# Patient Record
Sex: Female | Born: 1975 | Hispanic: No | Marital: Single | Smoking: Never smoker
Health system: Southern US, Community
[De-identification: ages and names within clinical notes are randomized; demographics above are authoritative.]

## PROBLEM LIST (undated history)

## (undated) DIAGNOSIS — I10 Essential (primary) hypertension: Secondary | ICD-10-CM

---

## 2003-03-10 ENCOUNTER — Inpatient Hospital Stay (HOSPITAL_COMMUNITY): Admission: EM | Admit: 2003-03-10 | Discharge: 2003-03-14 | Payer: Self-pay | Admitting: Emergency Medicine

## 2003-03-25 ENCOUNTER — Encounter: Admission: RE | Admit: 2003-03-25 | Discharge: 2003-03-25 | Payer: Self-pay | Admitting: Internal Medicine

## 2003-05-09 ENCOUNTER — Inpatient Hospital Stay (HOSPITAL_COMMUNITY): Admission: EM | Admit: 2003-05-09 | Discharge: 2003-05-11 | Payer: Self-pay | Admitting: Emergency Medicine

## 2005-03-14 ENCOUNTER — Emergency Department (HOSPITAL_COMMUNITY): Admission: EM | Admit: 2005-03-14 | Discharge: 2005-03-14 | Payer: Self-pay | Admitting: Emergency Medicine

## 2013-01-22 ENCOUNTER — Emergency Department (HOSPITAL_COMMUNITY)
Admission: EM | Admit: 2013-01-22 | Discharge: 2013-01-22 | Disposition: A | Discharge status: Home / Self Care | Payer: Medicaid Other | Attending: Emergency Medicine | Admitting: Emergency Medicine

## 2013-01-22 ENCOUNTER — Encounter (HOSPITAL_COMMUNITY): Payer: Self-pay | Admitting: Emergency Medicine

## 2013-01-22 ENCOUNTER — Emergency Department (HOSPITAL_COMMUNITY): Payer: Medicaid Other

## 2013-01-22 DIAGNOSIS — I10 Essential (primary) hypertension: Secondary | ICD-10-CM | POA: Insufficient documentation

## 2013-01-22 DIAGNOSIS — R52 Pain, unspecified: Secondary | ICD-10-CM | POA: Insufficient documentation

## 2013-01-22 DIAGNOSIS — R911 Solitary pulmonary nodule: Secondary | ICD-10-CM | POA: Diagnosis not present

## 2013-01-22 DIAGNOSIS — R05 Cough: Secondary | ICD-10-CM | POA: Diagnosis present

## 2013-01-22 DIAGNOSIS — J111 Influenza due to unidentified influenza virus with other respiratory manifestations: Secondary | ICD-10-CM | POA: Insufficient documentation

## 2013-01-22 DIAGNOSIS — R000 Tachycardia, unspecified: Secondary | ICD-10-CM | POA: Diagnosis not present

## 2013-01-22 DIAGNOSIS — Z79899 Other long term (current) drug therapy: Secondary | ICD-10-CM | POA: Insufficient documentation

## 2013-01-22 DIAGNOSIS — J9801 Acute bronchospasm: Secondary | ICD-10-CM

## 2013-01-22 HISTORY — DX: Essential (primary) hypertension: I10

## 2013-01-22 MED ORDER — PREDNISONE 20 MG PO TABS: ORAL_TABLET | ORAL | Status: DC

## 2013-01-22 MED ORDER — ALBUTEROL SULFATE HFA 108 (90 BASE) MCG/ACT IN AERS: 2.0000 | INHALATION_SPRAY | RESPIRATORY_TRACT | Status: AC | PRN

## 2013-01-22 MED ORDER — OXYCODONE-ACETAMINOPHEN 5-325 MG PO TABS: 2.0000 | ORAL_TABLET | ORAL | Status: DC | PRN

## 2013-01-22 NOTE — ED Notes (Signed)
Pt. reports persistent productive cough / chest congestion with chills and body aches since Christmas eve .

## 2013-01-22 NOTE — ED Provider Notes (Signed)
CSN: [id number]     Arrival date & time 01/22/13  2015 History   First MD Initiated Contact with Patient 01/22/13 2301     Chief Complaint  Patient presents with  . Cough  . Generalized Body Aches  . Chills   (Consider location/radiation/quality/duration/timing/severity/associated sxs/prior Treatment) HPI About one week of cough chills fever body aches nasal congestion rhinorrhea generalized weakness with a feeling of wheezing and slight shortness of breath no history of asthma no confusion no rash no vomiting no diarrhea no abdominal pain; no treatment prior to arrival except for ibuprofen which minimally helps the body aches. Past Medical History  Diagnosis Date  . Hypertension    Past Surgical History  Procedure Laterality Date  . Cesarean section     No family history on file. History  Substance Use Topics  . Smoking status: Never Smoker   . Smokeless tobacco: Not on file  . Alcohol Use: No   OB History   Grav Para Term Preterm Abortions TAB SAB Ect Mult Living                 Review of Systems 10 Systems reviewed and are negative for acute change except as noted in the HPI. Allergies  Review of patient's allergies indicates no known allergies.  Home Medications   Current Outpatient Rx  Name  Route  Sig  Dispense  Refill  . benzonatate (TESSALON) 100 MG capsule   Oral   Take 100 mg by mouth 3 (three) times daily as needed for cough.         . pseudoephedrine-guaifenesin (MUCINEX D) 60-600 MG per tablet   Oral   Take 1 tablet by mouth every 12 (twelve) hours.         Marland Kitchen albuterol (PROVENTIL HFA;VENTOLIN HFA) 108 (90 BASE) MCG/ACT inhaler   Inhalation   Inhale 2 puffs into the lungs every 2 (two) hours as needed for wheezing or shortness of breath (cough).   1 Inhaler   0   . oxyCODONE-acetaminophen (PERCOCET) 5-325 MG per tablet   Oral   Take 2 tablets by mouth every 4 (four) hours as needed.   20 tablet   0   . predniSONE (DELTASONE) 20 MG tablet      3 tabs po day one, then 2 po daily x 4 days   11 tablet   0    BP 162/74  Pulse 117  Temp(Src) 99.4 F (37.4 C) (Oral)  Resp 18  SpO2 91%  LMP 12/31/2012 Physical Exam  Nursing note and vitals reviewed. Constitutional:  Awake, alert, nontoxic appearance.  HENT:  Head: Atraumatic.  Eyes: Right eye exhibits no discharge. Left eye exhibits no discharge.  Neck: Neck supple.  Cardiovascular: Regular rhythm.   No murmur heard. Mildly tachycardic  Pulmonary/Chest: Effort normal. No respiratory distress. She has wheezes. She has no rales. She exhibits no tenderness.  Pulse oximetry low normal on room air 91% however no retractions no accessory muscle usage patient is able to speak phrases with faint scattered expiratory wheezes  Abdominal: Soft. There is no tenderness. There is no rebound.  Musculoskeletal: She exhibits tenderness. She exhibits no edema.  Baseline ROM, no obvious new focal weakness. Mild diffuse tenderness all 4 extremities and back.  Neurological: She is alert.  Mental status and motor strength appears baseline for patient and situation.  Skin: No rash noted.  Psychiatric: She has a normal mood and affect.    ED Course  Procedures (including critical care time)  Patient / Family / Caregiver informed of clinical course, understand medical decision-making process, and agree with plan. Labs Review Labs Reviewed - No data to display Imaging Review Dg Chest 2 View  01/22/2013   CLINICAL DATA:  Cough, headache, body aches, chills. Worsening over the past few days.  EXAM: CHEST  2 VIEW  COMPARISON:  03/14/2005  FINDINGS: Shallow inspiration. There is an new 10 mm nodular opacity in the right upper lung. This may be related to shallow inspiration and vascular crowding but and developing nodule is not excluded. Consider follow-up chest x-ray an 3 months after resolution of acute symptoms versus CT further evaluation. There is linear atelectasis in the lung bases. No  focal consolidation or airspace disease. No blunting of costophrenic angles. No pneumothorax. Normal heart size and pulmonary vascularity.  IMPRESSION: Shallow inspiration with atelectasis in the lung bases. New nodular opacity in the right upper lung. Consider follow-up radiograph versus CT as discussed. No findings suggesting pneumonia.   Electronically Signed   By: Burman Nieves M.D.   On: 01/22/2013 21:48    EKG Interpretation   None       MDM   1. Influenza   2. Bronchospasm, acute   3. Lung nodule seen on imaging study    I doubt any other EMC precluding discharge at this time including, but not necessarily limited to the following:SBI.    Hurman Horn, MD 01/23/13 [phone]

## 2013-01-24 ENCOUNTER — Emergency Department (HOSPITAL_COMMUNITY): Payer: Medicaid Other

## 2013-01-24 ENCOUNTER — Inpatient Hospital Stay (HOSPITAL_COMMUNITY)
Admission: EM | Admit: 2013-01-24 | Discharge: 2013-01-29 | DRG: 193 | Disposition: A | Discharge status: Home / Self Care | Payer: Medicaid Other | Attending: Internal Medicine | Admitting: Internal Medicine

## 2013-01-24 ENCOUNTER — Encounter (HOSPITAL_COMMUNITY): Payer: Self-pay | Admitting: Emergency Medicine

## 2013-01-24 DIAGNOSIS — D72829 Elevated white blood cell count, unspecified: Secondary | ICD-10-CM

## 2013-01-24 DIAGNOSIS — J189 Pneumonia, unspecified organism: Principal | ICD-10-CM | POA: Diagnosis present

## 2013-01-24 DIAGNOSIS — R0902 Hypoxemia: Secondary | ICD-10-CM

## 2013-01-24 DIAGNOSIS — J9801 Acute bronchospasm: Secondary | ICD-10-CM

## 2013-01-24 DIAGNOSIS — E876 Hypokalemia: Secondary | ICD-10-CM | POA: Diagnosis present

## 2013-01-24 DIAGNOSIS — I10 Essential (primary) hypertension: Secondary | ICD-10-CM | POA: Diagnosis present

## 2013-01-24 DIAGNOSIS — R911 Solitary pulmonary nodule: Secondary | ICD-10-CM

## 2013-01-24 DIAGNOSIS — R0602 Shortness of breath: Secondary | ICD-10-CM | POA: Diagnosis present

## 2013-01-24 DIAGNOSIS — R7309 Other abnormal glucose: Secondary | ICD-10-CM | POA: Diagnosis present

## 2013-01-24 DIAGNOSIS — J029 Acute pharyngitis, unspecified: Secondary | ICD-10-CM | POA: Diagnosis present

## 2013-01-24 DIAGNOSIS — J9819 Other pulmonary collapse: Secondary | ICD-10-CM | POA: Diagnosis present

## 2013-01-24 DIAGNOSIS — R0682 Tachypnea, not elsewhere classified: Secondary | ICD-10-CM

## 2013-01-24 DIAGNOSIS — J9600 Acute respiratory failure, unspecified whether with hypoxia or hypercapnia: Secondary | ICD-10-CM | POA: Diagnosis present

## 2013-01-24 DIAGNOSIS — Z8679 Personal history of other diseases of the circulatory system: Secondary | ICD-10-CM

## 2013-01-24 DIAGNOSIS — R739 Hyperglycemia, unspecified: Secondary | ICD-10-CM

## 2013-01-24 LAB — PRO B NATRIURETIC PEPTIDE: Pro B Natriuretic peptide (BNP): 170 pg/mL — ABNORMAL HIGH (ref 0–125)

## 2013-01-24 LAB — BASIC METABOLIC PANEL
GFR calc Af Amer: >90 mL/min (ref >90)
GFR calc non Af Amer: >90 mL/min (ref >90)
Potassium: 3.7 mEq/L (ref 3.7–5.3)
Sodium: 139 mEq/L (ref 137–147)

## 2013-01-24 LAB — POCT I-STAT, CHEM 8
HCT: 45 % (ref 36.0–46.0)
Hemoglobin: 15.3 g/dL — ABNORMAL HIGH (ref 12.0–15.0)
Potassium: 3.5 mEq/L — ABNORMAL LOW (ref 3.7–5.3)
Sodium: 140 mEq/L (ref 137–147)

## 2013-01-24 LAB — PHOSPHORUS: Phosphorus: 2.2 mg/dL — ABNORMAL LOW (ref 2.3–4.6)

## 2013-01-24 LAB — CBC
Hemoglobin: 14 g/dL (ref 12.0–15.0)
RBC: 4.51 MIL/uL (ref 3.87–5.11)

## 2013-01-24 LAB — MAGNESIUM: Magnesium: 2.2 mg/dL (ref 1.5–2.5)

## 2013-01-24 LAB — D-DIMER, QUANTITATIVE: D-Dimer, Quant: 0.69 ug/mL-FEU — ABNORMAL HIGH (ref 0.00–0.48)

## 2013-01-24 LAB — RAPID STREP SCREEN (MED CTR MEBANE ONLY): Streptococcus, Group A Screen (Direct): NEGATIVE

## 2013-01-24 MED ORDER — ENOXAPARIN SODIUM 40 MG/0.4ML ~~LOC~~ SOLN
40.0000 mg | SUBCUTANEOUS | Status: DC
Start: 2013-01-24 — End: 2013-01-29
  Administered 2013-01-24 – 2013-01-28 (×5): 40 mg via SUBCUTANEOUS
  Filled 2013-01-24 (×6): qty 0.4

## 2013-01-24 MED ORDER — BIOTENE DRY MOUTH MT LIQD
15.0000 mL | Freq: Two times a day (BID) | OROMUCOSAL | Status: DC
  Administered 2013-01-25 – 2013-01-29 (×9): 15 mL via OROMUCOSAL

## 2013-01-24 MED ORDER — MORPHINE SULFATE 4 MG/ML IJ SOLN
4.0000 mg | Freq: Once | INTRAMUSCULAR | Status: AC
  Administered 2013-01-24: 4 mg via INTRAVENOUS
  Filled 2013-01-24: qty 1

## 2013-01-24 MED ORDER — OSELTAMIVIR PHOSPHATE 75 MG PO CAPS
75.0000 mg | ORAL_CAPSULE | Freq: Two times a day (BID) | ORAL | Status: DC
  Administered 2013-01-24: 75 mg via ORAL
  Filled 2013-01-24 (×3): qty 1

## 2013-01-24 MED ORDER — METHYLPREDNISOLONE SODIUM SUCC 125 MG IJ SOLR
125.0000 mg | Freq: Two times a day (BID) | INTRAMUSCULAR | Status: DC
  Administered 2013-01-24 – 2013-01-28 (×8): 125 mg via INTRAVENOUS
  Filled 2013-01-24 (×9): qty 2

## 2013-01-24 MED ORDER — ACETAMINOPHEN 325 MG PO TABS: 650.0000 mg | ORAL_TABLET | Freq: Four times a day (QID) | ORAL | Status: DC | PRN

## 2013-01-24 MED ORDER — ALBUTEROL SULFATE (5 MG/ML) 0.5% IN NEBU
5.0000 mg | INHALATION_SOLUTION | Freq: Once | RESPIRATORY_TRACT | Status: AC
  Administered 2013-01-24: 5 mg via RESPIRATORY_TRACT
  Filled 2013-01-24: qty 6

## 2013-01-24 MED ORDER — SODIUM CHLORIDE 0.9 % IV BOLUS (SEPSIS)
2000.0000 mL | Freq: Once | INTRAVENOUS | Status: AC
  Administered 2013-01-24: 2000 mL via INTRAVENOUS

## 2013-01-24 MED ORDER — POTASSIUM CHLORIDE 10 MEQ/100ML IV SOLN
10.0000 meq | INTRAVENOUS | Status: AC
  Administered 2013-01-25 (×3): 10 meq via INTRAVENOUS
  Filled 2013-01-24 (×3): qty 100

## 2013-01-24 MED ORDER — AZITHROMYCIN 500 MG PO TABS
500.0000 mg | ORAL_TABLET | Freq: Every day | ORAL | Status: AC
  Administered 2013-01-24: 500 mg via ORAL
  Filled 2013-01-24: qty 1

## 2013-01-24 MED ORDER — DEXTROSE 5 % IV SOLN
1.0000 g | INTRAVENOUS | Status: DC
  Administered 2013-01-24: 1 g via INTRAVENOUS
  Filled 2013-01-24 (×2): qty 10

## 2013-01-24 MED ORDER — ALUM & MAG HYDROXIDE-SIMETH 200-200-20 MG/5ML PO SUSP: 30.0000 mL | Freq: Four times a day (QID) | ORAL | Status: DC | PRN

## 2013-01-24 MED ORDER — POTASSIUM CHLORIDE IN NACL 20-0.9 MEQ/L-% IV SOLN
INTRAVENOUS | Status: DC
  Administered 2013-01-24 – 2013-01-25 (×2): via INTRAVENOUS
  Administered 2013-01-26: 10:00:00 1000 mL via INTRAVENOUS
  Administered 2013-01-26 – 2013-01-28 (×5): via INTRAVENOUS
  Filled 2013-01-24 (×12): qty 1000

## 2013-01-24 MED ORDER — ALBUTEROL SULFATE (2.5 MG/3ML) 0.083% IN NEBU
2.5000 mg | INHALATION_SOLUTION | RESPIRATORY_TRACT | Status: DC | PRN
  Administered 2013-01-27: 2.5 mg via RESPIRATORY_TRACT
  Filled 2013-01-24: qty 3

## 2013-01-24 MED ORDER — MAGIC MOUTHWASH W/LIDOCAINE
5.0000 mL | Freq: Four times a day (QID) | ORAL | Status: DC | PRN
  Administered 2013-01-25: 5 mL via ORAL
  Filled 2013-01-24 (×2): qty 5

## 2013-01-24 MED ORDER — DOCUSATE SODIUM 100 MG PO CAPS
100.0000 mg | ORAL_CAPSULE | Freq: Two times a day (BID) | ORAL | Status: DC
  Administered 2013-01-24 – 2013-01-29 (×10): 100 mg via ORAL
  Filled 2013-01-24 (×13): qty 1

## 2013-01-24 MED ORDER — ACETAMINOPHEN 650 MG RE SUPP: 650.0000 mg | Freq: Four times a day (QID) | RECTAL | Status: DC | PRN

## 2013-01-24 MED ORDER — OSELTAMIVIR PHOSPHATE 75 MG PO CAPS
75.0000 mg | ORAL_CAPSULE | Freq: Once | ORAL | Status: AC
  Administered 2013-01-24: 75 mg via ORAL
  Filled 2013-01-24: qty 1

## 2013-01-24 MED ORDER — AZITHROMYCIN 250 MG PO TABS
250.0000 mg | ORAL_TABLET | Freq: Every day | ORAL | Status: DC
  Filled 2013-01-24: qty 1

## 2013-01-24 NOTE — ED Notes (Signed)
Pt.was sating at 90 % without o292%witho2 on.

## 2013-01-24 NOTE — Progress Notes (Signed)
ANTIBIOTIC CONSULT NOTE - INITIAL  Pharmacy Consult for ceftriaxone Indication: pneumonia  No Known Allergies  Patient Measurements: Height: 5' 2.4" (158.5 cm) Weight: 162 lb 12.8 oz (73.846 kg) IBW/kg (Calculated) : 51.02 Adjusted Body Weight:   Vital Signs: Temp: 98.3 F (36.8 C) (12/31 2023) Temp src: Oral (12/31 2023) BP: 120/68 mmHg (12/31 2023) Pulse Rate: 80 (12/31 2023) Intake/Output from previous day:   Intake/Output from this shift:    Labs:  Recent Labs  01/24/13 1720 01/24/13 1732  WBC 12.3*  --   HGB 14.0 15.3*  PLT 244  --   CREATININE 0.41* 0.50   Estimated Creatinine Clearance: 90.5 ml/min (by C-G formula based on Cr of 0.5). No results found for this basename: VANCOTROUGH, VANCOPEAK, VANCORANDOM, GENTTROUGH, GENTPEAK, GENTRANDOM, TOBRATROUGH, TOBRAPEAK, TOBRARND, AMIKACINPEAK, AMIKACINTROU, AMIKACIN,  in the last 72 hours   Microbiology: No results found for this or any previous visit (from the past 720 hour(s)).  Medical History: Past Medical History  Diagnosis Date  . Hypertension     Medications:  Scheduled:  . azithromycin  500 mg Oral Daily   Followed by  . [START ON 01/25/2013] azithromycin  250 mg Oral Daily  . docusate sodium  100 mg Oral BID  . enoxaparin (LOVENOX) injection  40 mg Subcutaneous Q24H  . methylPREDNISolone (SOLU-MEDROL) injection  125 mg Intravenous Q12H  . potassium chloride  10 mEq Intravenous Q1 Hr x 3   Infusions:  . 0.9 % NaCl with KCl 20 mEq / L     Assessment: 37 yo female with pneumonia will be put on ceftriaxone.     Plan:  1) Ceftriaxone 1g iv q24h  Marchelle Rinella, Tsz-Yin 01/24/2013,9:56 PM

## 2013-01-24 NOTE — H&P (Signed)
[facility] History and Physical  Morgan Hunter XBJ:[id number] DOB: 1975/04/10 DOA: 01/24/2013  Referring physician: ED PCP: No primary provider on file.  Specialists: none  Chief Complaint: dyspnea  HPI: Morgan Hunter is a 37 y.o. female  This patient is admitted for shortness of breath. She was seen in emergency room 2 nights ago for shortness of breath, coughing, and sore throat. She was discharged in stable condition albuterol inhaler, prednisone and some pain medicine. Since that time should 3 episodes of posttussive vomiting. The shortness of breath has become worse. Coughing has increased. Sore throat has not resolved. Emergency room she was found to have an oxygen sat on room air of 89-90 and a respiratory rate of 28 per minute 2 L oxygen her sats are found to be 94-95. X-ray shows worsening of airways disease compared to 2 nights ago although they note atypical infection including viral etiologies may have similar appearance. The patient denies any fevers over the last week. She says a few days ago she had myalgias but they are gone.  62 nights ago found a right upper lobe nodule. Recommendation was to repeat chest x-ray in 3 months versus CT scan. And today's chest x-ray shows decreased conspicuously of this nodule.  Review of Systems: The patient denies anorexia, fever, weight loss,, vision loss, decreased hearing, hoarseness, chest pain, syncope,peripheral edema, balance deficits, hemoptysis, abdominal pain, melena, hematochezia, severe indigestion/heartburn, hematuria, incontinence, genital sores, muscle weakness, suspicious skin lesions, transient blindness, difficulty walking, depression, unusual weight change, abnormal bleeding, enlarged lymph nodes, angioedema, and breast masses.   C/o dyspnea on exertion, shob, cough, post tussive emesis  Past Medical History  Diagnosis Date  . Hypertension    Past Surgical History  Procedure Laterality Date  .  Cesarean section     Social History:  reports that she has never smoked. She does not have any smokeless tobacco history on file. She reports that she does not drink alcohol or use illicit drugs. Pt lives at home and is able to do all ADLs  No Known Allergies  Family History  Problem Relation Age of Onset  . Hypertension Mother     Prior to Admission medications   Medication Sig Start Date End Date Taking? Authorizing Provider  albuterol (PROVENTIL HFA;VENTOLIN HFA) 108 (90 BASE) MCG/ACT inhaler Inhale 2 puffs into the lungs every 2 (two) hours as needed for wheezing or shortness of breath (cough). 01/22/13  Yes Hurman Horn, MD  HYDROcodone-acetaminophen (NORCO/VICODIN) 5-325 MG per tablet Take 2 tablets by mouth every 4 (four) hours as needed for moderate pain.   Yes Historical Provider, MD  predniSONE (DELTASONE) 20 MG tablet 3 tabs po day one, then 2 po daily x 4 days 01/22/13  Yes Hurman Horn, MD   Physical Exam: Filed Vitals:   01/24/13 2023  BP: 120/68  Pulse: 80  Temp: 98.3 F (36.8 C)  Resp: 22    Nursing note and vitals reviewed. Constitutional: She is oriented to person, place, and time. She appears well-developed and well-nourished. On supplemental oxygen she appears comfortable. HENT:  Nose: Nose normal.  Mouth/Throat: Oropharynx is clear and moist. No oropharyngeal exudate. erythematous throat. Eyes: Conjunctivae are normal. Pupils are equal, round, and reactive to light.  Neck: Normal range of motion. Neck supple. No thyromegaly present.  Cardiovascular: Normal rate, regular rhythm and normal heart sounds.   Pulmonary/Chest: mildly increased wob Abdominal: Soft. Bowel sounds are normal. She exhibits no distension. There is no tenderness. There is  no rebound.  Lymphadenopathy:    She has no cervical adenopathy.  Neurological: She is alert and oriented to person, place, and time. She has normal reflexes.  Skin: Skin is warm and dry.  Psychiatric: She has a  normal mood and affect. Her behavior is normal.    Labs on Admission:  Basic Metabolic Panel:  Recent Labs Lab 01/24/13 1720 01/24/13 1732  NA 139 140  K 3.7 3.5*  CL 97 99  CO2 26  --   GLUCOSE 157* 166*  BUN 6 5*  CREATININE 0.41* 0.50  CALCIUM 8.7  --    Liver Function Tests: No results found for this basename: AST, ALT, ALKPHOS, BILITOT, PROT, ALBUMIN,  in the last 168 hours No results found for this basename: LIPASE, AMYLASE,  in the last 168 hours No results found for this basename: AMMONIA,  in the last 168 hours CBC:  Recent Labs Lab 01/24/13 1720 01/24/13 1732  WBC 12.3*  --   HGB 14.0 15.3*  HCT 39.4 45.0  MCV 87.4  --   PLT 244  --    Cardiac Enzymes: No results found for this basename: CKTOTAL, CKMB, CKMBINDEX, TROPONINI,  in the last 168 hours  BNP (last 3 results) No results found for this basename: PROBNP,  in the last 8760 hours CBG: No results found for this basename: GLUCAP,  in the last 168 hours  Radiological Exams on Admission: Dg Chest 2 View (if Patient Has Fever And/or Copd)  01/24/2013   CLINICAL DATA:  Coughing for 2 days, shortness of breath, fever  EXAM: CHEST  2 VIEW  COMPARISON:  01/22/2013; 03/14/2005  FINDINGS: Grossly unchanged cardiac silhouette and mediastinal contours. Rather diffuse nodular thickening of the pulmonary interstitium appears to have worsened in the interval. The previously questioned right upper lung nodule is less conspicuous on the present examination. No new focal airspace opacities. No pleural effusion or pneumothorax. No evidence of edema. Unchanged bones.  IMPRESSION: 1. Suspected worsening of airways disease, though note, atypical infection (including viral etiologies) may have a similar appearance. 2. Decreased conspicuity of previously questioned nodular opacity within the right lung.   Electronically Signed   By: Simonne Come M.D.   On: 01/24/2013 16:22    EKG: Independently reviewed.  NSR  Assessment/Plan Active Problems:   Hypoxia   History of hypertension   Bronchospasm   Solitary lung nodule   Acute pharyngitis   Hypokalemia   Elevated blood sugar   Leukocytosis, unspecified   Tachypnea   1. Problem #1: Hypoxia: Influenza PCR is pending, continue Tamiflu and droplet precautions. Hydrate. Given chest x-ray finding of potential atypical infections not ruled out along with lack of fever or significant myalgias I think we need to consider other etiologies of her shortness of breath and hypoxia. We'll check a d-dimer, and cover with azithromycin and Rocephin for community-acquired pneumonia. Because of this we'll check a Legionella strep blood cultures and sputum culture. We'll continue oxygen by nasal cannula or mask is preferred by patient per nurse. And monitor her sats. 2. Problem #2 bronchospasm: Continue albuterol nebs when necessary as well as steroids. Will change prednisone to IV Solu-Medrol 125 every 12. As she just started prednisone 2 days ago she probably is not cortisol suppressed as. 3. Problem #3 lung nodule: Patient will need either a three-month followup chest x-ray or a CT to evaluate this nodule. 4. #4 hypertension: She is normotensive at this time so we'll just monitor. 5. #5 sore throat: We'll  check carotid strep test and if this were true continues but the rapid test is negative we could check a throat culture. Magic mouthwash gargle ordered when necessary 6. Problem #6 atelectasis: Noted on chest x-ray. We'll have her do incentive spirometer he turning and coughing. 7. #7 hypokalemia: 3.5 on admission. In part probably due to albuterol. Will replace this and followup with BMP in the morning 8. #8 hyperglycemia: Glucose was 166 in the ER today. Patient denies having eaten recently. She also denies diabetes in the past. Denies any diabetes in the family. She has been on steroids which may account for the elevated glucose. We'll check an A1c and if needed  we can start sliding scale 9. Leukocytosis: 12.3 today. It's possible this is from her steroid use: It's also possible that this is from infection. We'll cover with antibiotics. 10. Tachypnea: Supplemental oxygen has helped this for now so will watch how she does. 11. DVT prophylaxis: Lovenox and SCDs.  No consults at this time  Code Status: Full Code Family Communication: Husband and daughter with her  Disposition Plan: med surg with sat monitoring  Time spent: 60 minutes  Acey Lav [facility] Pager [phone]  If 7PM-7AM, please contact night-coverage [email or web address] Password [facility] 01/24/2013, 9:38 PM

## 2013-01-24 NOTE — ED Notes (Signed)
Per Ethelda Chick, MD, pt given a sandwich. Pt reported dizziness, so this RN placed her on 3L of O2. Pt now reports a decrease in her dizziness.

## 2013-01-24 NOTE — ED Notes (Signed)
Pt in X ray

## 2013-01-24 NOTE — ED Provider Notes (Signed)
CSN: [id number]     Arrival date & time 01/24/13  1532 History   First MD Initiated Contact with Patient 01/24/13 1559    History is obtained from [employer]. 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 Shortness of Breath   (Consider location/radiation/quality/duration/timing/severity/associated sxs/prior Treatment) Patient is a 37 y.o. female presenting with shortness of breath.  Shortness of Breath Associated symptoms: cough and vomiting    Presents with progressively worsening shortness of breath and cough diffuse body aches and sore throat onset 2 days ago. Patient seen here 01/22/2013 prescribed albuterol, hydrocodone and prednisone. Diagnosed with influenza. symptoms progressively worsening. She continues to complain of shortness of breath. She vomited 3 times today, posttussive vomiting. No other associated symptoms. Past Medical History  Diagnosis Date  . Hypertension    Past Surgical History  Procedure Laterality Date  . Cesarean section     No family history on file. History  Substance Use Topics  . Smoking status: Never Smoker   . Smokeless tobacco: Not on file  . Alcohol Use: No   OB History   Grav Para Term Preterm Abortions TAB SAB Ect Mult Living                 Review of Systems  Constitutional: Negative.   HENT: Negative.   Respiratory: Positive for cough and shortness of breath.   Cardiovascular: Negative.   Gastrointestinal: Positive for vomiting.  Musculoskeletal: Positive for myalgias.  Skin: Negative.   Neurological: Negative.   Psychiatric/Behavioral: Negative.   All other systems reviewed and are negative.    Allergies  Review of patient's allergies indicates no known allergies.  Home Medications   Current Outpatient Rx  Name  Route  Sig  Dispense  Refill  . albuterol (PROVENTIL HFA;VENTOLIN HFA) 108 (90 BASE) MCG/ACT inhaler   Inhalation   Inhale 2 puffs into the lungs every 2 (two) hours as needed  for wheezing or shortness of breath (cough).   1 Inhaler   0   . HYDROcodone-acetaminophen (NORCO/VICODIN) 5-325 MG per tablet   Oral   Take 2 tablets by mouth every 4 (four) hours as needed for moderate pain.         . predniSONE (DELTASONE) 20 MG tablet      3 tabs po day one, then 2 po daily x 4 days   11 tablet   0    BP 131/87  Pulse 90  Temp(Src) 98.7 F (37.1 C) (Oral)  Resp 22  Wt 168 lb (76.204 kg)  SpO2 94%  LMP 12/31/2012 Physical Exam  Nursing note and vitals reviewed. Constitutional: She appears well-developed and well-nourished. She appears distressed.  He is uncomfortable, ill appearing.  HENT:  Head: Normocephalic and atraumatic.  Oral pharynx reddened. Uvula midline  Eyes: Conjunctivae are normal. Pupils are equal, round, and reactive to light.  Neck: Neck supple. No tracheal deviation present. No thyromegaly present.  Cardiovascular: Normal rate and regular rhythm.   No murmur heard. Pulmonary/Chest: Effort normal.  Diffuse rhonchi. Tachypneic.  Abdominal: Soft. Bowel sounds are normal. She exhibits no distension. There is no tenderness.  Musculoskeletal: Normal range of motion. She exhibits no edema and no tenderness.  Neurological: She is alert. Coordination normal.  Skin: Skin is warm and dry. No rash noted.  Psychiatric: She has a normal mood and affect.    ED Course  Procedures (including critical care time) Labs Review  Breathing improved after nebulized treatment, oxygen, pain improved after  treatment with intravenous morphine. Labs Reviewed  BASIC METABOLIC PANEL  CBC   Imaging Review Dg Chest 2 View (if Patient Has Fever And/or Copd)  01/24/2013   CLINICAL DATA:  Coughing for 2 days, shortness of breath, fever  EXAM: CHEST  2 VIEW  COMPARISON:  01/22/2013; 03/14/2005  FINDINGS: Grossly unchanged cardiac silhouette and mediastinal contours. Rather diffuse nodular thickening of the pulmonary interstitium appears to have worsened in the  interval. The previously questioned right upper lung nodule is less conspicuous on the present examination. No new focal airspace opacities. No pleural effusion or pneumothorax. No evidence of edema. Unchanged bones.  IMPRESSION: 1. Suspected worsening of airways disease, though note, atypical infection (including viral etiologies) may have a similar appearance. 2. Decreased conspicuity of previously questioned nodular opacity within the right lung.   Electronically Signed   By: Simonne Come M.D.   On: 01/24/2013 16:22   Dg Chest 2 View  01/22/2013   CLINICAL DATA:  Cough, headache, body aches, chills. Worsening over the past few days.  EXAM: CHEST  2 VIEW  COMPARISON:  03/14/2005  FINDINGS: Shallow inspiration. There is an new 10 mm nodular opacity in the right upper lung. This may be related to shallow inspiration and vascular crowding but and developing nodule is not excluded. Consider follow-up chest x-ray an 3 months after resolution of acute symptoms versus CT further evaluation. There is linear atelectasis in the lung bases. No focal consolidation or airspace disease. No blunting of costophrenic angles. No pneumothorax. Normal heart size and pulmonary vascularity.  IMPRESSION: Shallow inspiration with atelectasis in the lung bases. New nodular opacity in the right upper lung. Consider follow-up radiograph versus CT as discussed. No findings suggesting pneumonia.   Electronically Signed   By: Burman Nieves M.D.   On: 01/22/2013 21:48    EKG Interpretation   None      Results for orders placed during the hospital encounter of 01/24/13  BASIC METABOLIC PANEL      Result Value Range   Sodium 139  137 - 147 mEq/L   Potassium 3.7  3.7 - 5.3 mEq/L   Chloride 97  96 - 112 mEq/L   CO2 26  19 - 32 mEq/L   Glucose, Bld 157 (*) 70 - 99 mg/dL   BUN 6  6 - 23 mg/dL   Creatinine, Ser 1.61 (*) 0.50 - 1.10 mg/dL   Calcium 8.7  8.4 - 09.6 mg/dL   GFR calc non Af Amer >90  >90 mL/min   GFR calc Af Amer  >90  >90 mL/min  CBC      Result Value Range   WBC 12.3 (*) 4.0 - 10.5 K/uL   RBC 4.51  3.87 - 5.11 MIL/uL   Hemoglobin 14.0  12.0 - 15.0 g/dL   HCT 04.5  40.9 - 81.1 %   MCV 87.4  78.0 - 100.0 fL   MCH 31.0  26.0 - 34.0 pg   MCHC 35.5  30.0 - 36.0 g/dL   RDW 91.4  78.2 - 95.6 %   Platelets 244  150 - 400 K/uL  POCT I-STAT, CHEM 8      Result Value Range   Sodium 140  137 - 147 mEq/L   Potassium 3.5 (*) 3.7 - 5.3 mEq/L   Chloride 99  96 - 112 mEq/L   BUN 5 (*) 6 - 23 mg/dL   Creatinine, Ser 2.13  0.50 - 1.10 mg/dL   Glucose, Bld 086 (*) 70 -  99 mg/dL   Calcium, Ion 7.82 (*) 1.12 - 1.23 mmol/L   TCO2 26  0 - 100 mmol/L   Hemoglobin 15.3 (*) 12.0 - 15.0 g/dL   HCT 95.6  21.3 - 08.6 %   Dg Chest 2 View (if Patient Has Fever And/or Copd)  01/24/2013   CLINICAL DATA:  Coughing for 2 days, shortness of breath, fever  EXAM: CHEST  2 VIEW  COMPARISON:  01/22/2013; 03/14/2005  FINDINGS: Grossly unchanged cardiac silhouette and mediastinal contours. Rather diffuse nodular thickening of the pulmonary interstitium appears to have worsened in the interval. The previously questioned right upper lung nodule is less conspicuous on the present examination. No new focal airspace opacities. No pleural effusion or pneumothorax. No evidence of edema. Unchanged bones.  IMPRESSION: 1. Suspected worsening of airways disease, though note, atypical infection (including viral etiologies) may have a similar appearance. 2. Decreased conspicuity of previously questioned nodular opacity within the right lung.   Electronically Signed   By: Simonne Come M.D.   On: 01/24/2013 16:22   Dg Chest 2 View  01/22/2013   CLINICAL DATA:  Cough, headache, body aches, chills. Worsening over the past few days.  EXAM: CHEST  2 VIEW  COMPARISON:  03/14/2005  FINDINGS: Shallow inspiration. There is an new 10 mm nodular opacity in the right upper lung. This may be related to shallow inspiration and vascular crowding but and developing  nodule is not excluded. Consider follow-up chest x-ray an 3 months after resolution of acute symptoms versus CT further evaluation. There is linear atelectasis in the lung bases. No focal consolidation or airspace disease. No blunting of costophrenic angles. No pneumothorax. Normal heart size and pulmonary vascularity.  IMPRESSION: Shallow inspiration with atelectasis in the lung bases. New nodular opacity in the right upper lung. Consider follow-up radiograph versus CT as discussed. No findings suggesting pneumonia.   Electronically Signed   By: Burman Nieves M.D.   On: 01/22/2013 21:48    CHEST XRAY VIEWED BY ME MDM  No diagnosis found. Patient has oxygen requirement. She remains dyspneic at home after treating herself with albuterol HFA every 2 hours. Spoke with Dr. Lucretia Roers Plan admit medical surgical floor Diagnosis #1 influenza #2 hypoxemia #3 HYPERGLYCEMIA   Doug Sou, MD 01/24/13 1821

## 2013-01-24 NOTE — ED Notes (Addendum)
Pt was dx with flu on Monday.  States she has been using inhaler with no relief.  Sats of 88 on RA with RR of 28.  Sats increased to 92% with 4L.  Pt now c/o R back pain and body aches.

## 2013-01-25 ENCOUNTER — Encounter (HOSPITAL_COMMUNITY): Payer: Self-pay | Admitting: *Deleted

## 2013-01-25 ENCOUNTER — Inpatient Hospital Stay (HOSPITAL_COMMUNITY): Payer: Medicaid Other

## 2013-01-25 DIAGNOSIS — R0682 Tachypnea, not elsewhere classified: Secondary | ICD-10-CM

## 2013-01-25 DIAGNOSIS — D72829 Elevated white blood cell count, unspecified: Secondary | ICD-10-CM

## 2013-01-25 LAB — CBC
HCT: 36.5 % (ref 36.0–46.0)
Hemoglobin: 12.3 g/dL (ref 12.0–15.0)
MCH: 30 pg (ref 26.0–34.0)
MCHC: 33.7 g/dL (ref 30.0–36.0)
MCV: 89 fL (ref 78.0–100.0)
Platelets: 246 10*3/uL (ref 150–400)
RBC: 4.1 MIL/uL (ref 3.87–5.11)
RDW: 13.3 % (ref 11.5–15.5)
WBC: 10 10*3/uL (ref 4.0–10.5)

## 2013-01-25 LAB — BASIC METABOLIC PANEL
BUN: 6 mg/dL (ref 6–23)
CO2: 21 mEq/L (ref 19–32)
Calcium: 8 mg/dL — ABNORMAL LOW (ref 8.4–10.5)
Chloride: 104 mEq/L (ref 96–112)
Creatinine, Ser: 0.4 mg/dL — ABNORMAL LOW (ref 0.50–1.10)
GFR calc Af Amer: >90 mL/min (ref >90)
GFR calc non Af Amer: >90 mL/min (ref >90)
Glucose, Bld: 213 mg/dL — ABNORMAL HIGH (ref 70–99)
Potassium: 3.9 mEq/L (ref 3.7–5.3)
Sodium: 140 mEq/L (ref 137–147)

## 2013-01-25 LAB — TSH: TSH: 1.789 u[IU]/mL (ref 0.350–4.500)

## 2013-01-25 LAB — HEMOGLOBIN A1C
Hgb A1c MFr Bld: 5.9 % — ABNORMAL HIGH (ref <5.7)
Mean Plasma Glucose: 123 mg/dL — ABNORMAL HIGH (ref <117)

## 2013-01-25 LAB — INFLUENZA PANEL BY PCR (TYPE A & B)
H1N1 flu by pcr: NOT DETECTED
Influenza A By PCR: NEGATIVE
Influenza B By PCR: NEGATIVE

## 2013-01-25 LAB — STREP PNEUMONIAE URINARY ANTIGEN: Strep Pneumo Urinary Antigen: NEGATIVE

## 2013-01-25 MED ORDER — DEXTROSE 5 % IV SOLN
500.0000 mg | INTRAVENOUS | Status: DC
  Administered 2013-01-25 – 2013-01-29 (×5): 500 mg via INTRAVENOUS
  Filled 2013-01-25 (×5): qty 500

## 2013-01-25 MED ORDER — HYDROCOD POLST-CHLORPHEN POLST 10-8 MG/5ML PO LQCR: 5.0000 mL | Freq: Two times a day (BID) | ORAL | Status: DC | PRN

## 2013-01-25 MED ORDER — IOHEXOL 350 MG/ML SOLN
100.0000 mL | Freq: Once | INTRAVENOUS | Status: AC | PRN
  Administered 2013-01-25: 100 mL via INTRAVENOUS

## 2013-01-25 MED ORDER — HYDROCOD POLST-CHLORPHEN POLST 10-8 MG/5ML PO LQCR
5.0000 mL | Freq: Two times a day (BID) | ORAL | Status: DC | PRN
  Administered 2013-01-25 – 2013-01-29 (×7): 5 mL via ORAL
  Filled 2013-01-25 (×7): qty 5

## 2013-01-25 NOTE — Progress Notes (Signed)
[facility] PROGRESS NOTE  Morgan Hunter ZOX:[id number] DOB: 1975-01-30 DOA: 01/24/2013 PCP: No primary provider on file.  Assessment/Plan: 1. Community acquire pneumonia. CT scan of lungs with IV contrast performed today showing multilobar, multifocal pneumonia characterized predominantly by micronodular airspace disease. She remains on IV antibiotic therapy with azithromycin and ceftriaxone. Blood cultures obtained on 01/24/2013 remaining sterile thus far. Continue IV antibiotics, supportive care. 2. Acute hypoxemic respiratory failure evidence by respiratory rate of 28, labored breathing, likely secondary to community acquire pneumonia. Patient presently stable on 4 L supplemental oxygen via nasal cannula. Continue to monitor closely on telemetry. 3. Suspected reactive airway disease. Patient's treated with nebs and steroids. 4. Elevated d-dimer. Patient with a CT scan of lungs with IV contrast which was negative for pulmonary embolism.  Code Status: Full code Family Communication:  I spoke with patient's husband present at bedside Disposition Plan: Continue monitoring on telemetry, broad-spectrum IV antibiotic therapy, supportive care    Antibiotics:  Ceftriaxone 1 g IV every 24 hours  (started on 01/24/2013 )   Azithromycin 500 mg IV every 24 hours (started on 01/24/2013)  HPI/Subjective: Patient is [name], admitted overnight presented with increasing cough and shortness of breath. Initially there was concerns for flu however she tests negative for influenza A and B. A CT scan of lungs with IV contrast this morning showed multilobar pneumonia. She remains on IV ceftriaxone and azithromycin. Patient was morning complaining of ongoing cough and some shortness of breath. Mild improvement since yesterday, however states feeling quite well still.   Objective: Filed Vitals:   01/25/13 1549  BP: 131/77  Pulse: 72  Temp: 97.6 F (36.4 C)   Resp: 20   No intake or output data in the 24 hours ending 01/25/13 1607 Filed Weights   01/24/13 1539 01/24/13 2023  Weight: 76.204 kg (168 lb) 73.846 kg (162 lb 12.8 oz)    Exam:   General:  Ill-appearing, in no acute distress awake alert oriented x3, mentating well  Cardiovascular: Tachycardic, regular rhythm normal S1-S2 no murmurs rubs   Respiratory: Patient presently in mild respiratory distress, on supplemental oxygen. She has bilateral crackles and rhonchi.  Abdomen: Soft nontender nondistended positive bowels  Musculoskeletal: No edema  Data Reviewed: Basic Metabolic Panel:  Recent Labs Lab 01/24/13 1720 01/24/13 1732 01/24/13 2230 01/25/13 0516  NA 139 140  --  140  K 3.7 3.5*  --  3.9  CL 97 99  --  104  CO2 26  --   --  21  GLUCOSE 157* 166*  --  213*  BUN 6 5*  --  6  CREATININE 0.41* 0.50  --  0.40*  CALCIUM 8.7  --   --  8.0*  MG  --   --  2.2  --   PHOS  --   --  2.2*  --    Liver Function Tests: No results found for this basename: AST, ALT, ALKPHOS, BILITOT, PROT, ALBUMIN,  in the last 168 hours No results found for this basename: LIPASE, AMYLASE,  in the last 168 hours No results found for this basename: AMMONIA,  in the last 168 hours CBC:  Recent Labs Lab 01/24/13 1720 01/24/13 1732 01/25/13 0516  WBC 12.3*  --  10.0  HGB 14.0 15.3* 12.3  HCT 39.4 45.0 36.5  MCV 87.4  --  89.0  PLT 244  --  246   Cardiac Enzymes: No results found for this basename: CKTOTAL, CKMB, CKMBINDEX, TROPONINI,  in the last 168 hours BNP (last 3 results)  Recent Labs  01/24/13 2230  PROBNP 170.0*   CBG: No results found for this basename: GLUCAP,  in the last 168 hours  Recent Results (from the past 240 hour(s))  RAPID STREP SCREEN     Status: None   Collection Time    01/24/13 10:00 PM      Result Value Range Status   Streptococcus, Group A Screen (Direct) NEGATIVE  NEGATIVE Final   Comment: (NOTE)     A Rapid Antigen test may result negative  if the antigen level in the     sample is below the detection level of this test. The FDA has not     cleared this test as a stand-alone test therefore the rapid antigen     negative result has reflexed to a Group A Strep culture.     Studies: Dg Chest 2 View (if Patient Has Fever And/or Copd)  01/24/2013   CLINICAL DATA:  Coughing for 2 days, shortness of breath, fever  EXAM: CHEST  2 VIEW  COMPARISON:  01/22/2013; 03/14/2005  FINDINGS: Grossly unchanged cardiac silhouette and mediastinal contours. Rather diffuse nodular thickening of the pulmonary interstitium appears to have worsened in the interval. The previously questioned right upper lung nodule is less conspicuous on the present examination. No new focal airspace opacities. No pleural effusion or pneumothorax. No evidence of edema. Unchanged bones.  IMPRESSION: 1. Suspected worsening of airways disease, though note, atypical infection (including viral etiologies) may have a similar appearance. 2. Decreased conspicuity of previously questioned nodular opacity within the right lung.   Electronically Signed   By: Simonne Come M.D.   On: 01/24/2013 16:22   Ct Angio Chest Pe W/cm &/or Wo Cm  01/25/2013   CLINICAL DATA:  Shortness of breath.  Increased D-dimer.  EXAM: CT ANGIOGRAPHY CHEST WITH CONTRAST  TECHNIQUE: Multidetector CT imaging of the chest was performed using the standard protocol during bolus administration of intravenous contrast. Multiplanar CT image reconstructions including MIPs were obtained to evaluate the vascular anatomy.  CONTRAST:  OMNIPAQUE IOHEXOL 350 MG/ML SOLN  COMPARISON:  Chest radiograph 01/24/2013  FINDINGS: Normal heart size. Thoracic aorta is normal in caliber and enhancement. Normal appearance of the thyroid gland, thoracic inlet, and esophagus.  There is hilar nodal tissue bilaterally, consistent with reactive lymph nodes. There are no pathologically enlarged hilar lymph nodes. 12 mm short axis subcarinal lymph  node noted.  Negative for pleural or pericardial effusion.  No focal filling defects are identified within the pulmonary arterial tree.  Lung windows demonstrate diffuse bilateral bronchial wall thickening and innumerable micronodular airspace opacities along the bronchovascular bundles, most prominently in the right upper lobe and right lower lobe. There are some more focal areas of consolidation airspace consolidation, for example image number 40 in the right upper lobe and image number 57 in the right of lower lobe. There are bandlike opacities in the left lower lobe and lingula which could reflect airspace consolidation and/or atelectasis. Patchy airspace disease is seen in the peripheral aspect of the lingula laterally.  Imaging of the upper abdomen demonstrates reflux of contrast into the hepatic veins.  No acute bony abnormalities identified.  Review of the MIP images confirms the above findings.  IMPRESSION: 1. Multilobar, multifocal pneumonia characterized predominantly by micronodular airspace disease, with a few larger areas of patchy airspace disease in the right upper lobe and right lower lobes. Pneumonia is worse in the right lung than  left. 2. Negative for pulmonary embolism. 3. Reactive size hilar and subcarinal lymph nodes.   Electronically Signed   By: Britta MccreedySusan  Turner M.D.   On: 01/25/2013 08:02    Scheduled Meds: . antiseptic oral rinse  15 mL Mouth Rinse BID  . azithromycin  500 mg Intravenous Q24H  . docusate sodium  100 mg Oral BID  . enoxaparin (LOVENOX) injection  40 mg Subcutaneous Q24H  . methylPREDNISolone (SOLU-MEDROL) injection  125 mg Intravenous Q12H   Continuous Infusions: . 0.9 % NaCl with KCl 20 mEq / L 100 mL/hr at 01/25/13 1308    Active Problems:   Hypoxia   History of hypertension   Bronchospasm   Solitary lung nodule   Acute pharyngitis   Hypokalemia   Elevated blood sugar   Leukocytosis, unspecified   Tachypnea    Time spent: 35 minutes    Jeralyn BennettZAMORA,  Apollos Tenbrink  [facility] Pager [phone]. If 7PM-7AM, please contact night-coverage at [email or web address], password [email or web address] 01/25/2013, 4:07 PM  LOS: 1 day

## 2013-01-25 NOTE — Progress Notes (Signed)
md notified of D dimer .69 and Phos 2.2

## 2013-01-26 ENCOUNTER — Inpatient Hospital Stay (HOSPITAL_COMMUNITY): Payer: Medicaid Other

## 2013-01-26 DIAGNOSIS — J9600 Acute respiratory failure, unspecified whether with hypoxia or hypercapnia: Secondary | ICD-10-CM

## 2013-01-26 DIAGNOSIS — J189 Pneumonia, unspecified organism: Principal | ICD-10-CM

## 2013-01-26 LAB — BASIC METABOLIC PANEL
BUN: 10 mg/dL (ref 6–23)
CO2: 22 mEq/L (ref 19–32)
Calcium: 7.9 mg/dL — ABNORMAL LOW (ref 8.4–10.5)
Chloride: 107 mEq/L (ref 96–112)
Creatinine, Ser: 0.43 mg/dL — ABNORMAL LOW (ref 0.50–1.10)
GFR calc Af Amer: >90 mL/min (ref >90)
GLUCOSE: 201 mg/dL — AB (ref 70–99)
POTASSIUM: 4.2 meq/L (ref 3.7–5.3)
SODIUM: 141 meq/L (ref 137–147)

## 2013-01-26 LAB — CBC
HCT: 34.8 % — ABNORMAL LOW (ref 36.0–46.0)
HEMOGLOBIN: 11.5 g/dL — AB (ref 12.0–15.0)
MCH: 29.6 pg (ref 26.0–34.0)
MCHC: 33 g/dL (ref 30.0–36.0)
MCV: 89.7 fL (ref 78.0–100.0)
Platelets: 248 10*3/uL (ref 150–400)
RBC: 3.88 MIL/uL (ref 3.87–5.11)
RDW: 13.4 % (ref 11.5–15.5)
WBC: 13.3 10*3/uL — AB (ref 4.0–10.5)

## 2013-01-26 LAB — CULTURE, GROUP A STREP

## 2013-01-26 LAB — LEGIONELLA ANTIGEN, URINE: Legionella Antigen, Urine: NEGATIVE

## 2013-01-26 NOTE — Progress Notes (Signed)
[facility] PROGRESS NOTE  Morgan Hunter AVW:[id number] DOB: 06-18-1975 DOA: 01/24/2013 PCP: No primary provider on file.  Assessment/Plan: 1. Community acquire pneumonia. CT scan of lungs with IV contrast performed today showing multilobar, multifocal pneumonia characterized predominantly by micronodular airspace disease. She remains on IV antibiotic therapy with azithromycin and ceftriaxone. Blood cultures obtained on 01/24/2013 remaining sterile thus far. Continue IV antibiotics, supportive care. 2. Acute hypoxemic respiratory failure on admission evidence by respiratory rate of 28, labored breathing, likely secondary to community acquire pneumonia. She continues to require 4L of supplemental oxygen via [place], however seems improved from a respiratory standpoint, will wean oxygen today.  3. Suspected reactive airway disease. Patient's treated with nebs and steroids. 4. Elevated d-dimer. Patient with a CT scan of lungs with IV contrast which was negative for pulmonary embolism.  Code Status: Full code Family Communication:  I spoke with patient's husband present at bedside Disposition Plan: Continue monitoring on telemetry, broad-spectrum IV antibiotic therapy, supportive care    Antibiotics:  Ceftriaxone 1 g IV every 24 hours  (started on 01/24/2013 )   Azithromycin 500 mg IV every 24 hours (started on 01/24/2013)  HPI/Subjective: Patient is [name], admitted overnight presented with increasing cough and shortness of breath. Initially there was concerns for flu however she tests negative for influenza A and B. A CT scan of lungs with IV contrast this morning showed multilobar pneumonia. She remains on IV ceftriaxone and azithromycin. Patient was morning complaining of ongoing cough and some shortness of breath.  Patient states feeling better, today, breathing easier. She has remained afebrile since admission.  Objective: Filed Vitals:    01/26/13 0600  BP: 128/69  Pulse: 101  Temp: 98.7 F (37.1 C)  Resp: 20    Intake/Output Summary (Last 24 hours) at 01/26/13 1646 Last data filed at 01/26/13 0900  Gross per 24 hour  Intake    358 ml  Output      0 ml  Net    358 ml   Filed Weights   01/24/13 1539 01/24/13 2023  Weight: 76.204 kg (168 lb) 73.846 kg (162 lb 12.8 oz)    Exam:   General:  Ill-appearing, in no acute distress awake alert oriented x3, mentating well  Cardiovascular: Tachycardic, regular rhythm normal S1-S2 no murmurs rubs   Respiratory: She is not in respiratory distress, on supplemental oxygen. Improved lung exam.  Abdomen: Soft nontender nondistended positive bowels  Musculoskeletal: No edema  Data Reviewed: Basic Metabolic Panel:  Recent Labs Lab 01/24/13 1720 01/24/13 1732 01/24/13 2230 01/25/13 0516 01/26/13 0522  NA 139 140  --  140 141  K 3.7 3.5*  --  3.9 4.2  CL 97 99  --  104 107  CO2 26  --   --  21 22  GLUCOSE 157* 166*  --  213* 201*  BUN 6 5*  --  6 10  CREATININE 0.41* 0.50  --  0.40* 0.43*  CALCIUM 8.7  --   --  8.0* 7.9*  MG  --   --  2.2  --   --   PHOS  --   --  2.2*  --   --    Liver Function Tests: No results found for this basename: AST, ALT, ALKPHOS, BILITOT, PROT, ALBUMIN,  in the last 168 hours No results found for this basename: LIPASE, AMYLASE,  in the last 168 hours No results found for this basename: AMMONIA,  in the last 168 hours  CBC:  Recent Labs Lab 01/24/13 1720 01/24/13 1732 01/25/13 0516 01/26/13 0522  WBC 12.3*  --  10.0 13.3*  HGB 14.0 15.3* 12.3 11.5*  HCT 39.4 45.0 36.5 34.8*  MCV 87.4  --  89.0 89.7  PLT 244  --  246 248   Cardiac Enzymes: No results found for this basename: CKTOTAL, CKMB, CKMBINDEX, TROPONINI,  in the last 168 hours BNP (last 3 results)  Recent Labs  01/24/13 2230  PROBNP 170.0*   CBG: No results found for this basename: GLUCAP,  in the last 168 hours  Recent Results (from the past 240 hour(s))   RAPID STREP SCREEN     Status: None   Collection Time    01/24/13 10:00 PM      Result Value Range Status   Streptococcus, Group A Screen (Direct) NEGATIVE  NEGATIVE Final   Comment: (NOTE)     A Rapid Antigen test may result negative if the antigen level in the     sample is below the detection level of this test. The FDA has not     cleared this test as a stand-alone test therefore the rapid antigen     negative result has reflexed to a Group A Strep culture.  CULTURE, GROUP A STREP     Status: None   Collection Time    01/24/13 10:00 PM      Result Value Range Status   Specimen Description THROAT   Final   Special Requests CX ADDED AT 2238 ON [id number]   Final   Culture     Final   Value: No Beta Hemolytic Streptococci Isolated     [facility]   Report Status 01/26/2013 FINAL   Final  CULTURE, BLOOD (ROUTINE X 2)     Status: None   Collection Time    01/24/13 10:20 PM      Result Value Range Status   Specimen Description BLOOD RIGHT ARM   Final   Special Requests BOTTLES DRAWN AEROBIC ONLY 10CC   Final   Culture  Setup Time     Final   Value: 01/25/2013 04:18     [facility]formed at [facility]   Culture     Final   Value:        BLOOD CULTURE RECEIVED NO GROWTH TO DATE CULTURE WILL BE HELD FOR 5 DAYS BEFORE ISSUING A FINAL NEGATIVE REPORT     [facility]formed [facility]   Report Status PENDING   Incomplete  CULTURE, BLOOD (ROUTINE X 2)     Status: None   Collection Time    01/24/13 10:31 PM      Result Value Range Status   Specimen Description BLOOD RIGHT HAND   Final   Special Requests BOTTLES DRAWN AEROBIC ONLY 10CC   Final   Culture  Setup Time     Final   Value: 01/25/2013 04:18     Performed at [facility]   Culture     Final   Value:        BLOOD CULTURE RECEIVED NO GROWTH TO DATE CULTURE WILL BE HELD FOR 5 DAYS BEFORE ISSUING A FINAL NEGATIVE REPORT     Performed at [facility]   Report Status PENDING   Incomplete      Studies: Dg Chest 2 View  01/26/2013   CLINICAL DATA:  Shortness of breath.  EXAM: CHEST  2 VIEW  COMPARISON:  CT 01/25/2013.  FINDINGS: Mediastinum and  hilar structures are normal. Nodular pulmonary infiltrates are present, these are faintly perceptible by standard chest x-ray. These are best seen on prior day's chest CT. No pleural effusion pneumothorax. Heart size and pulmonary vascularity normal. No acute osseous abnormality .  IMPRESSION: Previously identified nodular pulmonary infiltrates on chest CT of 01/25/2013 are present but are barely perceptible on chest x-ray. These are best seen on chest CT .   Electronically Signed   By: Maisie Fus  Register   On: 01/26/2013 07:46   Ct Angio Chest Pe W/cm &/or Wo Cm  01/25/2013   CLINICAL DATA:  Shortness of breath.  Increased D-dimer.  EXAM: CT ANGIOGRAPHY CHEST WITH CONTRAST  TECHNIQUE: Multidetector CT imaging of the chest was performed using the standard protocol during bolus administration of intravenous contrast. Multiplanar CT image reconstructions including MIPs were obtained to evaluate the vascular anatomy.  CONTRAST:  OMNIPAQUE IOHEXOL 350 MG/ML SOLN  COMPARISON:  Chest radiograph 01/24/2013  FINDINGS: Normal heart size. Thoracic aorta is normal in caliber and enhancement. Normal appearance of the thyroid gland, thoracic inlet, and esophagus.  There is hilar nodal tissue bilaterally, consistent with reactive lymph nodes. There are no pathologically enlarged hilar lymph nodes. 12 mm short axis subcarinal lymph node noted.  Negative for pleural or pericardial effusion.  No focal filling defects are identified within the pulmonary arterial tree.  Lung windows demonstrate diffuse bilateral bronchial wall thickening and innumerable micronodular airspace opacities along the bronchovascular bundles, most prominently in the right upper lobe and right lower lobe. There are some more focal areas of consolidation airspace consolidation, for example image  number 40 in the right upper lobe and image number 57 in the right of lower lobe. There are bandlike opacities in the left lower lobe and lingula which could reflect airspace consolidation and/or atelectasis. Patchy airspace disease is seen in the peripheral aspect of the lingula laterally.  Imaging of the upper abdomen demonstrates reflux of contrast into the hepatic veins.  No acute bony abnormalities identified.  Review of the MIP images confirms the above findings.  IMPRESSION: 1. Multilobar, multifocal pneumonia characterized predominantly by micronodular airspace disease, with a few larger areas of patchy airspace disease in the right upper lobe and right lower lobes. Pneumonia is worse in the right lung than left. 2. Negative for pulmonary embolism. 3. Reactive size hilar and subcarinal lymph nodes.   Electronically Signed   By: Britta Mccreedy M.D.   On: 01/25/2013 08:02    Scheduled Meds: . antiseptic oral rinse  15 mL Mouth Rinse BID  . azithromycin  500 mg Intravenous Q24H  . docusate sodium  100 mg Oral BID  . enoxaparin (LOVENOX) injection  40 mg Subcutaneous Q24H  . methylPREDNISolone (SOLU-MEDROL) injection  125 mg Intravenous Q12H   Continuous Infusions: . 0.9 % NaCl with KCl 20 mEq / L 1,000 mL (01/26/13 0959)    Active Problems:   Hypoxia   History of hypertension   Bronchospasm   Solitary lung nodule   Acute pharyngitis   Hypokalemia   Elevated blood sugar   Leukocytosis, unspecified   Tachypnea    Time spent: 35 minutes    Jeralyn Bennett  [facility] Pager [phone]. If 7PM-7AM, please contact night-coverage at [email or web address], password [facility] 01/26/2013, 4:46 PM  LOS: 2 days

## 2013-01-26 NOTE — Progress Notes (Signed)
Utilization review completed. Sereniti Wan, RN, BSN. 

## 2013-01-26 NOTE — Progress Notes (Signed)
Nutrition Brief Note  Patient identified on the Malnutrition Screening Tool (MST) Report. Patient reports to this RD that her usual body weight is 160 lb. This is consistent with current weight. Presently with good appetite.  Wt Readings from Last 15 Encounters:  01/24/13 162 lb 12.8 oz (73.846 kg)    Body mass index is 29.39 kg/(m^2). Patient meets criteria for overweight based on current BMI.   Current diet order is Regular, patient is consuming approximately 100% of meals at this time. Labs and medications reviewed.   No nutrition interventions warranted at this time. If nutrition issues arise, please consult RD.   Morgan MottoSamantha Naleigha Raimondi MS, RD, LDN Pager: [phone] After-hours pager: [phone]

## 2013-01-27 LAB — EXPECTORATED SPUTUM ASSESSMENT W GRAM STAIN, RFLX TO RESP C

## 2013-01-27 LAB — EXPECTORATED SPUTUM ASSESSMENT W REFEX TO RESP CULTURE: Special Requests: NORMAL

## 2013-01-27 NOTE — Progress Notes (Signed)
[facility] PROGRESS NOTE  Morgan HeysMaria G Hunter ZOX:[id number] DOB: 03-Dec-1975 DOA: 01/24/2013 PCP: No primary provider on file.  Assessment/Plan: 1. Community acquire pneumonia. CT scan of lungs with IV contrast performed today showing multilobar, multifocal pneumonia characterized predominantly by micronodular airspace disease. She remains on IV antibiotic therapy with azithromycin and ceftriaxone. Blood cultures obtained on 01/24/2013 remaining sterile thus far. Continue IV antibiotics, supportive care. 2. Acute hypoxemic respiratory failure on admission evidence by respiratory rate of 28, labored breathing, likely secondary to community acquire pneumonia. She continues to require 4L of supplemental oxygen via [place], however seems improved from a respiratory standpoint, will continue weaning her oxygen.   3. Suspected reactive airway disease. Patient's treated with nebs and steroids. 4. Elevated d-dimer. Patient with a CT scan of lungs with IV contrast which was negative for pulmonary embolism.  Code Status: Full code Family Communication:  I spoke with patient's husband present at bedside Disposition Plan: Continue monitoring on telemetry, broad-spectrum IV antibiotic therapy, supportive care, anticipate discharge in the next 24 - 48 hours    Antibiotics:  Ceftriaxone 1 g IV every 24 hours  (started on 01/24/2013 )   Azithromycin 500 mg IV every 24 hours (started on 01/24/2013)  HPI/Subjective: Patient is [name], admitted overnight presented with increasing cough and shortness of breath. Initially there was concerns for flu however she tests negative for influenza A and B. A CT scan of lungs with IV contrast this morning showed multilobar pneumonia. She remains on IV ceftriaxone and azithromycin. Patient was morning complaining of ongoing cough and some shortness of breath.  She continues to improve, we ambulated down the hallway with portable  oxygen. She is tolerating PO intake, remains afebrile.   Objective: Filed Vitals:   01/27/13 1437  BP: 139/76  Pulse: 64  Temp: 97.5 F (36.4 C)  Resp: 18    Intake/Output Summary (Last 24 hours) at 01/27/13 1657 Last data filed at 01/27/13 1203  Gross per 24 hour  Intake 2981.66 ml  Output      0 ml  Net 2981.66 ml   Filed Weights   01/24/13 1539 01/24/13 2023  Weight: 76.204 kg (168 lb) 73.846 kg (162 lb 12.8 oz)    Exam:   General:  Ill-appearing, in no acute distress awake alert oriented x3, mentating well  Cardiovascular: Tachycardic, regular rhythm normal S1-S2 no murmurs rubs   Respiratory: She is not in respiratory distress, on supplemental oxygen. Improved lung exam.  Abdomen: Soft nontender nondistended positive bowels  Musculoskeletal: No edema  Data Reviewed: Basic Metabolic Panel:  Recent Labs Lab 01/24/13 1720 01/24/13 1732 01/24/13 2230 01/25/13 0516 01/26/13 0522  NA 139 140  --  140 141  K 3.7 3.5*  --  3.9 4.2  CL 97 99  --  104 107  CO2 26  --   --  21 22  GLUCOSE 157* 166*  --  213* 201*  BUN 6 5*  --  6 10  CREATININE 0.41* 0.50  --  0.40* 0.43*  CALCIUM 8.7  --   --  8.0* 7.9*  MG  --   --  2.2  --   --   PHOS  --   --  2.2*  --   --    Liver Function Tests: No results found for this basename: AST, ALT, ALKPHOS, BILITOT, PROT, ALBUMIN,  in the last 168 hours No results found for this basename: LIPASE, AMYLASE,  in the last 168 hours No  results found for this basename: AMMONIA,  in the last 168 hours CBC:  Recent Labs Lab 01/24/13 1720 01/24/13 1732 01/25/13 0516 01/26/13 0522  WBC 12.3*  --  10.0 13.3*  HGB 14.0 15.3* 12.3 11.5*  HCT 39.4 45.0 36.5 34.8*  MCV 87.4  --  89.0 89.7  PLT 244  --  246 248   Cardiac Enzymes: No results found for this basename: CKTOTAL, CKMB, CKMBINDEX, TROPONINI,  in the last 168 hours BNP (last 3 results)  Recent Labs  01/24/13 2230  PROBNP 170.0*   CBG: No results found for this  basename: GLUCAP,  in the last 168 hours  Recent Results (from the past 240 hour(s))  RAPID STREP SCREEN     Status: None   Collection Time    01/24/13 10:00 PM      Result Value Range Status   Streptococcus, Group A Screen (Direct) NEGATIVE  NEGATIVE Final   Comment: (NOTE)     A Rapid Antigen test may result negative if the antigen level in the     sample is below the detection level of this test. The FDA has not     cleared this test as a stand-alone test therefore the rapid antigen     negative result has reflexed to a Group A Strep culture.  CULTURE, GROUP A STREP     Status: None   Collection Time    01/24/13 10:00 PM      Result Value Range Status   Specimen Description THROAT   Final   Special Requests CX ADDED AT 2238 ON [id number]   Final   Culture     Final   Value: No Beta Hemolytic Streptococci Isolated     Performed at [facility]   Report Status 01/26/2013 FINAL   Final  CULTURE, BLOOD (ROUTINE X 2)     Status: None   Collection Time    01/24/13 10:20 PM      Result Value Range Status   Specimen Description BLOOD RIGHT ARM   Final   Special Requests BOTTLES DRAWN AEROBIC ONLY 10CC   Final   Culture  Setup Time     Final   Value: 01/25/2013 04:18     Performed at [facility]   Culture     Final   Value:        BLOOD CULTURE RECEIVED NO GROWTH TO DATE CULTURE WILL BE HELD FOR 5 DAYS BEFORE ISSUING A FINAL NEGATIVE REPORT     Performed at [facility]   Report Status PENDING   Incomplete  CULTURE, BLOOD (ROUTINE X 2)     Status: None   Collection Time    01/24/13 10:31 PM      Result Value Range Status   Specimen Description BLOOD RIGHT HAND   Final   Special Requests BOTTLES DRAWN AEROBIC ONLY 10CC   Final   Culture  Setup Time     Final   Value: 01/25/2013 04:18     Performed at [facility]   Culture     Final   Value:        BLOOD CULTURE RECEIVED NO GROWTH TO DATE CULTURE WILL BE HELD FOR 5 DAYS BEFORE ISSUING A FINAL  NEGATIVE REPORT     Performed at [facility]   Report Status PENDING   Incomplete  CULTURE, EXPECTORATED SPUTUM-ASSESSMENT     Status: None   Collection Time    01/27/13  5:22 AM  Result Value Range Status   Specimen Description SPUTUM   Final   Special Requests Normal   Final   Sputum evaluation     Final   Value: THIS SPECIMEN IS ACCEPTABLE. RESPIRATORY CULTURE REPORT TO FOLLOW.   Report Status 01/27/2013 FINAL   Final     Studies: Dg Chest 2 View  01/26/2013   CLINICAL DATA:  Shortness of breath.  EXAM: CHEST  2 VIEW  COMPARISON:  CT 01/25/2013.  FINDINGS: Mediastinum and hilar structures are normal. Nodular pulmonary infiltrates are present, these are faintly perceptible by standard chest x-ray. These are best seen on prior day's chest CT. No pleural effusion pneumothorax. Heart size and pulmonary vascularity normal. No acute osseous abnormality .  IMPRESSION: Previously identified nodular pulmonary infiltrates on chest CT of 01/25/2013 are present but are barely perceptible on chest x-ray. These are best seen on chest CT .   Electronically Signed   By: Maisie Fus  Register   On: 01/26/2013 07:46    Scheduled Meds: . antiseptic oral rinse  15 mL Mouth Rinse BID  . azithromycin  500 mg Intravenous Q24H  . docusate sodium  100 mg Oral BID  . enoxaparin (LOVENOX) injection  40 mg Subcutaneous Q24H  . methylPREDNISolone (SOLU-MEDROL) injection  125 mg Intravenous Q12H   Continuous Infusions: . 0.9 % NaCl with KCl 20 mEq / L 100 mL/hr at 01/27/13 4010    Active Problems:   Hypoxia   History of hypertension   Bronchospasm   Solitary lung nodule   Acute pharyngitis   Hypokalemia   Elevated blood sugar   Leukocytosis, unspecified   Tachypnea    Time spent: 35 minutes    Jeralyn Bennett  [facility] Pager [phone]. If 7PM-7AM, please contact night-coverage at [email or web address], password [facility] 01/27/2013, 4:57 PM  LOS: 3 days

## 2013-01-28 ENCOUNTER — Inpatient Hospital Stay (HOSPITAL_COMMUNITY): Payer: Medicaid Other

## 2013-01-28 NOTE — Progress Notes (Signed)
[facility] PROGRESS NOTE  Morgan Hunter ZOX:[id number] DOB: 1975-07-19 DOA: 01/24/2013 PCP: No primary provider on file.  Assessment/Plan: 1. Community acquire pneumonia. CT scan of lungs with IV contrast performed today showing multilobar, multifocal pneumonia characterized predominantly by micronodular airspace disease. She remains on IV antibiotic therapy with azithromycin and ceftriaxone. Blood cultures obtained on 01/24/2013 remaining sterile thus far. Continue IV antibiotics, supportive care. 2. Acute hypoxemic respiratory failure on admission evidence by respiratory rate of 28, labored breathing, likely secondary to community acquire pneumonia. Her oxygen requirements coming down from 4 L via nasal cannula 2 L today. I think she is showing daily clinical improvement.  3. Suspected reactive airway disease. Improved lung exam today with near resolution of wheezing, will discontinue IV steroids, continue nebs as needed. 4. Elevated d-dimer. Patient with a CT scan of lungs with IV contrast which was negative for pulmonary embolism.  Code Status: Full code Family Communication:  I spoke with patient's husband present at bedside Disposition Plan: Continue monitoring on telemetry, broad-spectrum IV antibiotic therapy, supportive care, anticipate discharge in the next 24 - 48 hours    Antibiotics:  Ceftriaxone 1 g IV every 24 hours  (started on 01/24/2013 )   Azithromycin 500 mg IV every 24 hours (started on 01/24/2013)  HPI/Subjective: Patient is [name], admitted overnight presented with increasing cough and shortness of breath. Initially there was concerns for flu however she tests negative for influenza A and B. A CT scan of lungs with IV contrast this morning showed multilobar pneumonia. She remains on IV ceftriaxone and azithromycin. Patient was morning complaining of ongoing cough and some shortness of breath.  Showing daily clinical  improvement, tolerating by mouth intake, continues to be afebrile. Oxygen he weaned down to 2 L today..   Objective: Filed Vitals:   01/28/13 1406  BP: 143/104  Pulse: 66  Temp: 98 F (36.7 C)  Resp: 20    Intake/Output Summary (Last 24 hours) at 01/28/13 1745 Last data filed at 01/28/13 0715  Gross per 24 hour  Intake 1621.67 ml  Output      0 ml  Net 1621.67 ml   Filed Weights   01/24/13 1539 01/24/13 2023  Weight: 76.204 kg (168 lb) 73.846 kg (162 lb 12.8 oz)    Exam:   General:  Overall improvement, ambulated around her room with supplemental oxygen  Cardiovascular: Tachycardic, regular rhythm normal S1-S2 no murmurs rubs   Respiratory: She is not in respiratory distress, on supplemental oxygen. Decreased wheezing, good air movement  Abdomen: Soft nontender nondistended positive bowels  Musculoskeletal: No edema  Data Reviewed: Basic Metabolic Panel:  Recent Labs Lab 01/24/13 1720 01/24/13 1732 01/24/13 2230 01/25/13 0516 01/26/13 0522  NA 139 140  --  140 141  K 3.7 3.5*  --  3.9 4.2  CL 97 99  --  104 107  CO2 26  --   --  21 22  GLUCOSE 157* 166*  --  213* 201*  BUN 6 5*  --  6 10  CREATININE 0.41* 0.50  --  0.40* 0.43*  CALCIUM 8.7  --   --  8.0* 7.9*  MG  --   --  2.2  --   --   PHOS  --   --  2.2*  --   --    Liver Function Tests: No results found for this basename: AST, ALT, ALKPHOS, BILITOT, PROT, ALBUMIN,  in the last 168 hours No results found for  this basename: LIPASE, AMYLASE,  in the last 168 hours No results found for this basename: AMMONIA,  in the last 168 hours CBC:  Recent Labs Lab 01/24/13 1720 01/24/13 1732 01/25/13 0516 01/26/13 0522  WBC 12.3*  --  10.0 13.3*  HGB 14.0 15.3* 12.3 11.5*  HCT 39.4 45.0 36.5 34.8*  MCV 87.4  --  89.0 89.7  PLT 244  --  246 248   Cardiac Enzymes: No results found for this basename: CKTOTAL, CKMB, CKMBINDEX, TROPONINI,  in the last 168 hours BNP (last 3 results)  Recent Labs   01/24/13 2230  PROBNP 170.0*   CBG: No results found for this basename: GLUCAP,  in the last 168 hours  Recent Results (from the past 240 hour(s))  RAPID STREP SCREEN     Status: None   Collection Time    01/24/13 10:00 PM      Result Value Range Status   Streptococcus, Group A Screen (Direct) NEGATIVE  NEGATIVE Final   Comment: (NOTE)     A Rapid Antigen test may result negative if the antigen level in the     sample is below the detection level of this test. The FDA has not     cleared this test as a stand-alone test therefore the rapid antigen     negative result has reflexed to a Group A Strep culture.  CULTURE, GROUP A STREP     Status: None   Collection Time    01/24/13 10:00 PM      Result Value Range Status   Specimen Description THROAT   Final   Special Requests CX ADDED AT 2238 ON [id number]   Final   Culture     Final   Value: No Beta Hemolytic Streptococci Isolated     [facility]   Report Status 01/26/2013 FINAL   Final  CULTURE, BLOOD (ROUTINE X 2)     Status: None   Collection Time    01/24/13 10:20 PM      Result Value Range Status   Specimen Description BLOOD RIGHT ARM   Final   Special Requests BOTTLES DRAWN AEROBIC ONLY 10CC   Final   Culture  Setup Time     Final   Value: 01/25/2013 04:18     Performed at [facility]   Culture     Final   Value:        BLOOD CULTURE RECEIVED NO GROWTH TO DATE CULTURE WILL BE HELD FOR 5 DAYS BEFORE ISSUING A FINAL NEGATIVE REPORT     [facility]formed at [facility]   Report Status PENDING   Incomplete  CULTURE, BLOOD (ROUTINE X 2)     Status: None   Collection Time    01/24/13 10:31 PM      Result Value Range Status   Specimen Description BLOOD RIGHT HAND   Final   Special Requests BOTTLES DRAWN AEROBIC ONLY 10CC   Final   Culture  Setup Time     Final   Value: 01/25/2013 04:18     Performed at [facility]   Culture     Final   Value:        BLOOD CULTURE RECEIVED NO GROWTH TO  DATE CULTURE WILL BE HELD FOR 5 DAYS BEFORE ISSUING A FINAL NEGATIVE REPORT     Performed at [facility]   Report Status PENDING   Incomplete  CULTURE, EXPECTORATED SPUTUM-ASSESSMENT     Status: None  Collection Time    01/27/13  5:22 AM      Result Value Range Status   Specimen Description SPUTUM   Final   Special Requests Normal   Final   Sputum evaluation     Final   Value: THIS SPECIMEN IS ACCEPTABLE. RESPIRATORY CULTURE REPORT TO FOLLOW.   Report Status 01/27/2013 FINAL   Final  CULTURE, RESPIRATORY (NON-EXPECTORATED)     Status: None   Collection Time    01/27/13  5:22 AM      Result Value Range Status   Specimen Description SPUTUM   Final   Special Requests NONE   Final   Gram Stain     Final   Value: ABUNDANT WBC PRESENT, PREDOMINANTLY PMN     FEW SQUAMOUS EPITHELIAL CELLS PRESENT     FEW GRAM POSITIVE COCCI IN PAIRS     IN CLUSTERS IN CHAINS     Performed at [facility]   Culture PENDING   Incomplete   Report Status PENDING   Incomplete     Studies: Dg Chest 2 View  01/28/2013   CLINICAL DATA:  Pneumonia.  EXAM: CHEST  2 VIEW  COMPARISON:  01/26/2013  FINDINGS: The cardiomediastinal silhouette is within normal limits. The lungs remain mildly hypoinflated. Mild patchy opacities, predominantly in the right lung, described on the prior study are less conspicuous on the current exam. A linear opacity in the left lung base likely reflects subsegmental atelectasis. There is no evidence of pleural effusion, pneumothorax, or new airspace consolidation. No acute osseous abnormality is identified.  IMPRESSION: Decreased conspicuity of previously described patchy lung opacities. Mild hypoinflation with left basilar subsegmental atelectasis. No evidence of new airspace disease or pleural effusion.   Electronically Signed   By: Sebastian Ache   On: 01/28/2013 15:12    Scheduled Meds: . antiseptic oral rinse  15 mL Mouth Rinse BID  . azithromycin  500 mg Intravenous Q24H   . docusate sodium  100 mg Oral BID  . enoxaparin (LOVENOX) injection  40 mg Subcutaneous Q24H  . methylPREDNISolone (SOLU-MEDROL) injection  125 mg Intravenous Q12H   Continuous Infusions:    Active Problems:   Hypoxia   History of hypertension   Bronchospasm   Solitary lung nodule   Acute pharyngitis   Hypokalemia   Elevated blood sugar   Leukocytosis, unspecified   Tachypnea    Time spent: 35 minutes    Jeralyn Bennett  [facility] Pager [phone]. If 7PM-7AM, please contact night-coverage at [email or web address], password [employer] 01/28/2013, 5:45 PM  LOS: 4 days

## 2013-01-29 LAB — CULTURE, RESPIRATORY: CULTURE: NORMAL

## 2013-01-29 MED ORDER — HYDROCOD POLST-CHLORPHEN POLST 10-8 MG/5ML PO LQCR: 5.0000 mL | Freq: Two times a day (BID) | ORAL | Status: DC | PRN

## 2013-01-29 MED ORDER — LEVOFLOXACIN 750 MG PO TABS
750.0000 mg | ORAL_TABLET | Freq: Every day | ORAL | Status: DC
Start: 2013-01-29 — End: 2013-04-02

## 2013-01-29 NOTE — Discharge Summary (Signed)
Physician Discharge Summary  Morgan Hunter ZOX:[id number] DOB: Jun 02, 1975 DOA: 01/24/2013  PCP: No primary provider on file.  Admit date: 01/24/2013 Discharge date: 01/29/2013  Time spent: 35 minutes  Recommendations for Outpatient Follow-up:  1. Please follow up on CBC on hospital follow up appointment  Discharge Diagnoses:  Active Problems:   Hypoxia   History of hypertension   Bronchospasm   Solitary lung nodule   Acute pharyngitis   Hypokalemia   Elevated blood sugar   Leukocytosis, unspecified   Tachypnea   Discharge Condition: Stable/improved  Diet recommendation: Heart healthy diet  Filed Weights   01/24/13 1539 01/24/13 2023  Weight: 76.204 kg (168 lb) 73.846 kg (162 lb 12.8 oz)    History of present illness:  Morgan Hunter is a 38 y.o. female  This patient is admitted for shortness of breath. She was seen in emergency room 2 nights ago for shortness of breath, coughing, and sore throat. She was discharged in stable condition albuterol inhaler, prednisone and some pain medicine. Since that time should 3 episodes of posttussive vomiting. The shortness of breath has become worse. Coughing has increased. Sore throat has not resolved. Emergency room she was found to have an oxygen sat on room air of 89-90 and a respiratory rate of 28 per minute 2 L oxygen her sats are found to be 94-95. X-ray shows worsening of airways disease compared to 2 nights ago although they note atypical infection including viral etiologies may have similar appearance. The patient denies any fevers over the last week. She says a few days ago she had myalgias but they are gone.  62 nights ago found a right upper lobe nodule. Recommendation was to repeat chest x-ray in 3 months versus CT scan. And today's chest x-ray shows decreased conspicuously of this nodule.  Hospital Course:  Patient is [name] with no severe past medical history who was admitted  to the medicine service on 01/24/2013. She presented to the emergency department with complaints of shortness of breath, cough, malaise and fatigue. She was found to be in acute respiratory failure, having a respiratory to 28, showing signs of respiratory distress. A chest x-ray performed in the emergency department showed suspected worsening of airway disease. It was felt that atypical infection and viral etiologies could have a similar appearance. Given concern for influenza A she was started on Tamiflu. She was also started on empiric IV antibiotic therapy with azithromycin and ceftriaxone. On the following morning she had a CT scan of lungs with IV contrast which showed multilobar, multifocal pneumonia, negative for pulmonary embolism. Or flu swab came back negative and Tamiflu was discontinued. Patient was continued on IV azithromycin and ceftriaxone over the next following days. She showed gradual clinical improvement as a repeat chest x-ray performed on 01/29/2012 show decreased conspicuity of previously described patchy lung opacities. Blood cultures obtained on 01/24/2013 remained sterile. Her oxygen was weaned off by 01/29/2013 as she stated feeling well enough to be discharged. She was discharged in stable condition on 01/29/2013. Prior to discharge case management was consulted to set patient up with local primary care provider for hospital follow-up.    Consultations:  Case management  Discharge Exam: Filed Vitals:   01/29/13 0443  BP: 136/72  Pulse: 65  Temp: 97.8 F (36.6 C)  Resp: 20    General: Patient appears improved, but now off of supplemental lidocaine, tolerating by mouth intake, states feeling well enough to go home today Cardiovascular: Regular rate  rhythm normal S1-S2 Respiratory: Overall improvement to lung exam, clear to auscultation bilaterally Abdomen: Soft nontender nondistended Extremities: No edema  Discharge Instructions  Discharge Orders   Future  Appointments Provider Department Dept Phone   03/01/2013 2:30 PM [facility] Financial Counselor [facility] [phone]   03/01/2013 3:00 PM Jeanann Lewandowsky, MD [facility] [phone]   Future Orders Complete By Expires   Call MD for:  difficulty breathing, headache or visual disturbances  As directed    Call MD for:  extreme fatigue  As directed    Call MD for:  persistant dizziness or light-headedness  As directed    Call MD for:  persistant nausea and vomiting  As directed    Call MD for:  temperature >100.4  As directed    Diet - low sodium heart healthy  As directed    Increase activity slowly  As directed        Medication List    STOP taking these medications       HYDROcodone-acetaminophen 5-325 MG per tablet  Commonly known as:  NORCO/VICODIN     predniSONE 20 MG tablet  Commonly known as:  DELTASONE      TAKE these medications       albuterol 108 (90 BASE) MCG/ACT inhaler  Commonly known as:  PROVENTIL HFA;VENTOLIN HFA  Inhale 2 puffs into the lungs every 2 (two) hours as needed for wheezing or shortness of breath (cough).     chlorpheniramine-HYDROcodone 10-8 MG/5ML Lqcr  Commonly known as:  TUSSIONEX PENNKINETIC ER  Take 5 mLs by mouth every 12 (twelve) hours as needed for cough.     levofloxacin 750 MG tablet  Commonly known as:  LEVAQUIN  Take 1 tablet (750 mg total) by mouth daily.       No Known Allergies     Follow-up Information   Follow up with [facility]. Schedule an appointment as soon as possible for a visit in 1 week. (2:30 for charity program elibility and hospital follow up at 3 pm)    Contact information:   [address] [place] [address] [phone]       The results of significant diagnostics from this hospitalization (including imaging, microbiology, ancillary and laboratory) are listed below for reference.    Significant  Diagnostic Studies: Dg Chest 2 View  01/28/2013   CLINICAL DATA:  Pneumonia.  EXAM: CHEST  2 VIEW  COMPARISON:  01/26/2013  FINDINGS: The cardiomediastinal silhouette is within normal limits. The lungs remain mildly hypoinflated. Mild patchy opacities, predominantly in the right lung, described on the prior study are less conspicuous on the current exam. A linear opacity in the left lung base likely reflects subsegmental atelectasis. There is no evidence of pleural effusion, pneumothorax, or new airspace consolidation. No acute osseous abnormality is identified.  IMPRESSION: Decreased conspicuity of previously described patchy lung opacities. Mild hypoinflation with left basilar subsegmental atelectasis. No evidence of new airspace disease or pleural effusion.   Electronically Signed   By: Sebastian Ache   On: 01/28/2013 15:12   Dg Chest 2 View  01/26/2013   CLINICAL DATA:  Shortness of breath.  EXAM: CHEST  2 VIEW  COMPARISON:  CT 01/25/2013.  FINDINGS: Mediastinum and hilar structures are normal. Nodular pulmonary infiltrates are present, these are faintly perceptible by standard chest x-ray. These are best seen on prior day's chest CT. No pleural effusion pneumothorax. Heart size and pulmonary vascularity  normal. No acute osseous abnormality .  IMPRESSION: Previously identified nodular pulmonary infiltrates on chest CT of 01/25/2013 are present but are barely perceptible on chest x-ray. These are best seen on chest CT .   Electronically Signed   By: Maisie Fus  Register   On: 01/26/2013 07:46   Dg Chest 2 View (if Patient Has Fever And/or Copd)  01/24/2013   CLINICAL DATA:  Coughing for 2 days, shortness of breath, fever  EXAM: CHEST  2 VIEW  COMPARISON:  01/22/2013; 03/14/2005  FINDINGS: Grossly unchanged cardiac silhouette and mediastinal contours. Rather diffuse nodular thickening of the pulmonary interstitium appears to have worsened in the interval. The previously questioned right upper lung nodule is less  conspicuous on the present examination. No new focal airspace opacities. No pleural effusion or pneumothorax. No evidence of edema. Unchanged bones.  IMPRESSION: 1. Suspected worsening of airways disease, though note, atypical infection (including viral etiologies) may have a similar appearance. 2. Decreased conspicuity of previously questioned nodular opacity within the right lung.   Electronically Signed   By: Simonne Come M.D.   On: 01/24/2013 16:22   Dg Chest 2 View  01/22/2013   CLINICAL DATA:  Cough, headache, body aches, chills. Worsening over the past few days.  EXAM: CHEST  2 VIEW  COMPARISON:  03/14/2005  FINDINGS: Shallow inspiration. There is an new 10 mm nodular opacity in the right upper lung. This may be related to shallow inspiration and vascular crowding but and developing nodule is not excluded. Consider follow-up chest x-ray an 3 months after resolution of acute symptoms versus CT further evaluation. There is linear atelectasis in the lung bases. No focal consolidation or airspace disease. No blunting of costophrenic angles. No pneumothorax. Normal heart size and pulmonary vascularity.  IMPRESSION: Shallow inspiration with atelectasis in the lung bases. New nodular opacity in the right upper lung. Consider follow-up radiograph versus CT as discussed. No findings suggesting pneumonia.   Electronically Signed   By: Burman Nieves M.D.   On: 01/22/2013 21:48   Ct Angio Chest Pe W/cm &/or Wo Cm  01/25/2013   CLINICAL DATA:  Shortness of breath.  Increased D-dimer.  EXAM: CT ANGIOGRAPHY CHEST WITH CONTRAST  TECHNIQUE: Multidetector CT imaging of the chest was performed using the standard protocol during bolus administration of intravenous contrast. Multiplanar CT image reconstructions including MIPs were obtained to evaluate the vascular anatomy.  CONTRAST:  OMNIPAQUE IOHEXOL 350 MG/ML SOLN  COMPARISON:  Chest radiograph 01/24/2013  FINDINGS: Normal heart size. Thoracic aorta is normal in  caliber and enhancement. Normal appearance of the thyroid gland, thoracic inlet, and esophagus.  There is hilar nodal tissue bilaterally, consistent with reactive lymph nodes. There are no pathologically enlarged hilar lymph nodes. 12 mm short axis subcarinal lymph node noted.  Negative for pleural or pericardial effusion.  No focal filling defects are identified within the pulmonary arterial tree.  Lung windows demonstrate diffuse bilateral bronchial wall thickening and innumerable micronodular airspace opacities along the bronchovascular bundles, most prominently in the right upper lobe and right lower lobe. There are some more focal areas of consolidation airspace consolidation, for example image number 40 in the right upper lobe and image number 57 in the right of lower lobe. There are bandlike opacities in the left lower lobe and lingula which could reflect airspace consolidation and/or atelectasis. Patchy airspace disease is seen in the peripheral aspect of the lingula laterally.  Imaging of the upper abdomen demonstrates reflux of contrast into the hepatic veins.  No acute bony abnormalities identified.  Review of the MIP images confirms the above findings.  IMPRESSION: 1. Multilobar, multifocal pneumonia characterized predominantly by micronodular airspace disease, with a few larger areas of patchy airspace disease in the right upper lobe and right lower lobes. Pneumonia is worse in the right lung than left. 2. Negative for pulmonary embolism. 3. Reactive size hilar and subcarinal lymph nodes.   Electronically Signed   By: Britta Mccreedy M.D.   On: 01/25/2013 08:02    Microbiology: Recent Results (from the past 240 hour(s))  RAPID STREP SCREEN     Status: None   Collection Time    01/24/13 10:00 PM      Result Value Range Status   Streptococcus, Group A Screen (Direct) NEGATIVE  NEGATIVE Final   Comment: (NOTE)     A Rapid Antigen test may result negative if the antigen level in the     sample is  below the detection level of this test. The FDA has not     cleared this test as a stand-alone test therefore the rapid antigen     negative result has reflexed to a Group A Strep culture.  CULTURE, GROUP A STREP     Status: None   Collection Time    01/24/13 10:00 PM      Result Value Range Status   Specimen Description THROAT   Final   Special Requests CX ADDED AT 2238 ON 161096   Final   Culture     Final   Value: No Beta Hemolytic Streptococci Isolated     Performed at [facility]   Report Status 01/26/2013 FINAL   Final  CULTURE, BLOOD (ROUTINE X 2)     Status: None   Collection Time    01/24/13 10:20 PM      Result Value Range Status   Specimen Description BLOOD RIGHT ARM   Final   Special Requests BOTTLES DRAWN AEROBIC ONLY 10CC   Final   Culture  Setup Time     Final   Value: 01/25/2013 04:18     Performed at [facility]   Culture     Final   Value:        BLOOD CULTURE RECEIVED NO GROWTH TO DATE CULTURE WILL BE HELD FOR 5 DAYS BEFORE ISSUING A FINAL NEGATIVE REPORT     Performed at [facility]   Report Status PENDING   Incomplete  CULTURE, BLOOD (ROUTINE X 2)     Status: None   Collection Time    01/24/13 10:31 PM      Result Value Range Status   Specimen Description BLOOD RIGHT HAND   Final   Special Requests BOTTLES DRAWN AEROBIC ONLY 10CC   Final   Culture  Setup Time     Final   Value: 01/25/2013 04:18     Performed at [facility]   Culture     Final   Value:        BLOOD CULTURE RECEIVED NO GROWTH TO DATE CULTURE WILL BE HELD FOR 5 DAYS BEFORE ISSUING A FINAL NEGATIVE REPORT     Performed at [facility]   Report Status PENDING   Incomplete  CULTURE, EXPECTORATED SPUTUM-ASSESSMENT     Status: None   Collection Time    01/27/13  5:22 AM      Result Value Range Status   Specimen Description SPUTUM   Final   Special Requests Normal   Final  Sputum evaluation     Final   Value: THIS SPECIMEN IS ACCEPTABLE.  RESPIRATORY CULTURE REPORT TO FOLLOW.   Report Status 01/27/2013 FINAL   Final  CULTURE, RESPIRATORY (NON-EXPECTORATED)     Status: None   Collection Time    01/27/13  5:22 AM      Result Value Range Status   Specimen Description SPUTUM   Final   Special Requests NONE   Final   Gram Stain     Final   Value: ABUNDANT WBC PRESENT, PREDOMINANTLY PMN     FEW SQUAMOUS EPITHELIAL CELLS PRESENT     FEW GRAM POSITIVE COCCI IN PAIRS     IN CLUSTERS IN CHAINS     [facility]   Culture     Final   Value: NORMAL OROPHARYNGEAL FLORA     [facility]formed at [facility]   Report Status 01/29/2013 FINAL   Final     Labs: Basic Metabolic Panel:  Recent Labs Lab 01/24/13 1720 01/24/13 1732 01/24/13 2230 01/25/13 0516 01/26/13 0522  NA 139 140  --  140 141  K 3.7 3.5*  --  3.9 4.2  CL 97 99  --  104 107  CO2 26  --   --  21 22  GLUCOSE 157* 166*  --  213* 201*  BUN 6 5*  --  6 10  CREATININE 0.41* 0.50  --  0.40* 0.43*  CALCIUM 8.7  --   --  8.0* 7.9*  MG  --   --  2.2  --   --   PHOS  --   --  2.2*  --   --    Liver Function Tests: No results found for this basename: AST, ALT, ALKPHOS, BILITOT, PROT, ALBUMIN,  in the last 168 hours No results found for this basename: LIPASE, AMYLASE,  in the last 168 hours No results found for this basename: AMMONIA,  in the last 168 hours CBC:  Recent Labs Lab 01/24/13 1720 01/24/13 1732 01/25/13 0516 01/26/13 0522  WBC 12.3*  --  10.0 13.3*  HGB 14.0 15.3* 12.3 11.5*  HCT 39.4 45.0 36.5 34.8*  MCV 87.4  --  89.0 89.7  PLT 244  --  246 248   Cardiac Enzymes: No results found for this basename: CKTOTAL, CKMB, CKMBINDEX, TROPONINI,  in the last 168 hours BNP: BNP (last 3 results)  Recent Labs  01/24/13 2230  PROBNP 170.0*   CBG: No results found for this basename: GLUCAP,  in the last 168 hours     Signed:  Jeralyn BennettZAMORA, Yuliza Cara  [facility] 01/29/2013, 2:42 PM

## 2013-01-29 NOTE — Care Management Note (Signed)
    Page 1 of 1   01/29/2013     2:45:01 PM   CARE MANAGEMENT NOTE 01/29/2013  Patient:  Morgan Hunter,Morgan Hunter   Account Number:  [id number]  Date Initiated:  01/29/2013  Documentation initiated by:  Letha CapeAYLOR,Novak Stgermaine  Subjective/Objective Assessment:   dx pna  admit- lives with spouse.     Action/Plan:   Anticipated DC Date:  01/29/2013   Anticipated DC Plan:  HOME/SELF CARE      DC Planning Services  CM consult  Follow-up appt scheduled  [facility]      Choice offered to / List presented to:             Status of service:  Completed, signed off Medicare Important Message given?   (If response is "NO", the following Medicare IM given date fields will be blank) Date Medicare IM given:   Date Additional Medicare IM given:    Discharge Disposition:  HOME/SELF CARE  Per UR Regulation:  Reviewed for med. necessity/level of care/duration of stay  If discussed at Long Length of Stay Meetings, dates discussed:    Comments:  01/29/13 14:42 Letha Capeeborah Ahni Bradwell RN, BSN [phone] [id number] patient lives with spouse, pta indep.  Patient states she does not have a pcp, NCM [facility], also patient states she needs ast with medications, NCM ast patient with Match Program explained this could only be used once this year with interpreter.  Patient states she has transportation to go home.  NCM gave patient the Match Letter , explained to give to pharmacist with scripts.

## 2013-01-29 NOTE — Progress Notes (Signed)
Nsg Discharge Note  Admit Date:  01/24/2013 Discharge date: 01/29/2013   Morgan HeysMaria G Hunter to be D/C'd Home per MD order.  AVS completed.  Copy for chart, and copy for patient signed, and dated. Patient/caregiver able to verbalize understanding.  Discharge Medication:   Medication List    STOP taking these medications       HYDROcodone-acetaminophen 5-325 MG per tablet  Commonly known as:  NORCO/VICODIN     predniSONE 20 MG tablet  Commonly known as:  DELTASONE      TAKE these medications       albuterol 108 (90 BASE) MCG/ACT inhaler  Commonly known as:  PROVENTIL HFA;VENTOLIN HFA  Inhale 2 puffs into the lungs every 2 (two) hours as needed for wheezing or shortness of breath (cough).     chlorpheniramine-HYDROcodone 10-8 MG/5ML Lqcr  Commonly known as:  TUSSIONEX PENNKINETIC ER  Take 5 mLs by mouth every 12 (twelve) hours as needed for cough.     levofloxacin 750 MG tablet  Commonly known as:  LEVAQUIN  Take 1 tablet (750 mg total) by mouth daily.        Discharge Assessment: Filed Vitals:   01/29/13 1310  BP: 124/85  Pulse: 74  Temp: 97.6 F (36.4 C)  Resp: 20   Skin clean, dry and intact without evidence of skin break down, no evidence of skin tears noted. IV catheter discontinued intact. Site without signs and symptoms of complications - no redness or edema noted at insertion site, patient denies c/o pain - only slight tenderness at site.  Dressing with slight pressure applied.  D/c Instructions-Education: Discharge instructions given to patient/family with verbalized understanding. D/c education completed with patient/family including follow up instructions, medication list, d/c activities limitations if indicated, with other d/c instructions as indicated by MD - patient able to verbalize understanding, all questions fully answered. Patient instructed to return to ED, call 911, or call MD for any changes in condition.  Patient escorted via WC, and D/C  home via private auto.  Kern ReapBrumagin, Yosselyn Tax L, RN 01/29/2013 3:18 PM

## 2013-01-31 LAB — CULTURE, BLOOD (ROUTINE X 2)
Culture: NO GROWTH
Culture: NO GROWTH

## 2013-03-01 ENCOUNTER — Ambulatory Visit: Payer: Self-pay

## 2013-03-01 ENCOUNTER — Ambulatory Visit: Payer: Self-pay | Attending: Internal Medicine | Admitting: Internal Medicine

## 2013-03-01 ENCOUNTER — Encounter: Payer: Self-pay | Admitting: Internal Medicine

## 2013-03-01 VITALS — BP 134/89 | HR 73 | Temp 99.0°F | Resp 16 | Ht 64.0 in | Wt 168.0 lb

## 2013-03-01 DIAGNOSIS — Z79899 Other long term (current) drug therapy: Secondary | ICD-10-CM | POA: Insufficient documentation

## 2013-03-01 DIAGNOSIS — I10 Essential (primary) hypertension: Secondary | ICD-10-CM | POA: Insufficient documentation

## 2013-03-01 DIAGNOSIS — R519 Headache, unspecified: Secondary | ICD-10-CM | POA: Insufficient documentation

## 2013-03-01 DIAGNOSIS — R7309 Other abnormal glucose: Secondary | ICD-10-CM | POA: Insufficient documentation

## 2013-03-01 DIAGNOSIS — G4733 Obstructive sleep apnea (adult) (pediatric): Secondary | ICD-10-CM

## 2013-03-01 DIAGNOSIS — R51 Headache: Secondary | ICD-10-CM

## 2013-03-01 DIAGNOSIS — R7303 Prediabetes: Secondary | ICD-10-CM

## 2013-03-01 LAB — CBC WITH DIFFERENTIAL/PLATELET
Basophils Absolute: 0 10*3/uL (ref 0.0–0.1)
Basophils Relative: 0 % (ref 0–1)
Eosinophils Absolute: 0.1 10*3/uL (ref 0.0–0.7)
Eosinophils Relative: 1 % (ref 0–5)
HCT: 42.1 % (ref 36.0–46.0)
HEMOGLOBIN: 14.5 g/dL (ref 12.0–15.0)
LYMPHS ABS: 2.7 10*3/uL (ref 0.7–4.0)
LYMPHS PCT: 32 % (ref 12–46)
MCH: 29.4 pg (ref 26.0–34.0)
MCHC: 34.4 g/dL (ref 30.0–36.0)
MCV: 85.4 fL (ref 78.0–100.0)
Monocytes Absolute: 0.6 10*3/uL (ref 0.1–1.0)
Monocytes Relative: 7 % (ref 3–12)
NEUTROS PCT: 60 % (ref 43–77)
Neutro Abs: 5 10*3/uL (ref 1.7–7.7)
Platelets: 255 10*3/uL (ref 150–400)
RBC: 4.93 MIL/uL (ref 3.87–5.11)
RDW: 13.7 % (ref 11.5–15.5)
WBC: 8.5 10*3/uL (ref 4.0–10.5)

## 2013-03-01 LAB — LIPID PANEL
Cholesterol: 192 mg/dL (ref 0–200)
HDL: 34 mg/dL — AB (ref >39)
Total CHOL/HDL Ratio: 5.6 Ratio
Triglycerides: 554 mg/dL — ABNORMAL HIGH (ref <150)

## 2013-03-01 LAB — COMPLETE METABOLIC PANEL WITH GFR
ALK PHOS: 78 U/L (ref 39–117)
ALT: 30 U/L (ref 0–35)
AST: 20 U/L (ref 0–37)
Albumin: 4.4 g/dL (ref 3.5–5.2)
BILIRUBIN TOTAL: 0.4 mg/dL (ref 0.2–1.2)
BUN: 9 mg/dL (ref 6–23)
CO2: 28 mEq/L (ref 19–32)
Calcium: 9.2 mg/dL (ref 8.4–10.5)
Chloride: 105 mEq/L (ref 96–112)
Creat: 0.6 mg/dL (ref 0.50–1.10)
GFR, Est African American: >89 mL/min
GLUCOSE: 110 mg/dL — AB (ref 70–99)
Potassium: 3.9 mEq/L (ref 3.5–5.3)
SODIUM: 141 meq/L (ref 135–145)
Total Protein: 7.2 g/dL (ref 6.0–8.3)

## 2013-03-01 LAB — TSH: TSH: 1.689 u[IU]/mL (ref 0.350–4.500)

## 2013-03-01 MED ORDER — NAPROXEN 500 MG PO TABS: 500.0000 mg | ORAL_TABLET | Freq: Two times a day (BID) | ORAL | Status: AC

## 2013-03-01 MED ORDER — NAPROXEN 500 MG PO TABS: 500.0000 mg | ORAL_TABLET | Freq: Two times a day (BID) | ORAL | Status: DC

## 2013-03-01 NOTE — Progress Notes (Signed)
HFU Pt had pneumonia.  Today pt feels much better. Her only C.C. Is that she gets pain in her head when she bends down or moves suddenly.

## 2013-03-01 NOTE — Progress Notes (Signed)
Patient ID: Morgan Hunter, female   DOB: Jul 07, 1975, 38 y.o.   MRN: [id number]   Tumeka Hunter, is a 38 y.o. female  XLK:[id number]  [id number]:[id number]  DOB - 1975-07-27  Chief Complaint  Patient presents with  . Hospitalization Follow-up        Subjective:   Morgan Hunter is a 38 y.o. female here today to establish medical care. She was recently admitted to the hospital for pneumonia and was treated with IV ceftriaxone and Zithromax. She got better and was discharged on by mouth levofloxacin and given an appointment for followup in our clinic. She claims she has been doing very well, cough is improved, no chest pain no fever, she has some nagging headache occasionally especially when she bends down. No blurry vision, no double vision. She claims she has been told that she snores loudly with episodes of apnea, she has not had a sleep study done. She has no personal history of hypertension or diabetes but has been told in the past that her blood pressure was high, not on medications. She does not smoke cigarette, she does not drink alcohol. Patient has No headache, No chest pain, No abdominal pain - No Nausea, No new weakness tingling or numbness, No Cough - SOB.  Problem  Headache(784.0)  Prediabetes  Osa (Obstructive Sleep Apnea)    ALLERGIES: No Known Allergies  PAST MEDICAL HISTORY: Past Medical History  Diagnosis Date  . Hypertension     MEDICATIONS AT HOME: Prior to Admission medications   Medication Sig Start Date End Date Taking? Authorizing Provider  albuterol (PROVENTIL HFA;VENTOLIN HFA) 108 (90 BASE) MCG/ACT inhaler Inhale 2 puffs into the lungs every 2 (two) hours as needed for wheezing or shortness of breath (cough). 01/22/13   Hurman Horn, MD  chlorpheniramine-HYDROcodone (TUSSIONEX PENNKINETIC ER) 10-8 MG/5ML LQCR Take 5 mLs by mouth every 12 (twelve) hours as needed for cough. 01/29/13   Jeralyn Bennett, MD  levofloxacin (LEVAQUIN) 750 MG  tablet Take 1 tablet (750 mg total) by mouth daily. 01/29/13   Jeralyn Bennett, MD  naproxen (NAPROSYN) 500 MG tablet Take 1 tablet (500 mg total) by mouth 2 (two) times daily with a meal. 03/01/13   Jeanann Lewandowsky, MD     Objective:   Filed Vitals:   03/01/13 1524  BP: 134/89  Pulse: 73  Temp: 99 F (37.2 C)  TempSrc: Oral  Resp: 16  Height: 5\' 4"  (1.626 m)  Weight: 168 lb (76.204 kg)  SpO2: 96%    Exam General appearance : Awake, alert, not in any distress. Speech Clear. Not toxic looking HEENT: Atraumatic and Normocephalic, pupils equally reactive to light and accomodation Neck: supple, no JVD. No cervical lymphadenopathy.  Chest:Good air entry bilaterally, no added sounds  CVS: S1 S2 regular, no murmurs.  Abdomen: Bowel sounds present, Non tender and not distended with no gaurding, rigidity or rebound. Extremities: B/L Lower Ext shows no edema, both legs are warm to touch Neurology: Awake alert, and oriented X 3, CN II-XII intact, Non focal Skin:No Rash Wounds:N/A  Data Review  Assessment & Plan   1. Headache(784.0)  - naproxen (NAPROSYN) 500 MG tablet; Take 1 tablet (500 mg total) by mouth 2 (two) times daily with a meal.  Dispense: 30 tablet; Refill: 0  2. Prediabetes  - CBC with Differential - Lipid panel - TSH - Urinalysis, Complete - COMPLETE METABOLIC PANEL WITH GFR  3. OSA (obstructive sleep apnea)  - Split night study; Future  Interpreter was used to  communicate directly with patient for the entire encounter including providing detailed patient instructions.   Return in about 3 months (around 05/29/2013), or if symptoms worsen or fail to improve, for Annual Physical.  The patient was given clear instructions to go to ER or return to medical center if symptoms don't improve, worsen or new problems develop. The patient verbalized understanding. The patient was told to call to get lab results if they haven't heard anything in the next week.    Jeanann LewandowskyJEGEDE,  Lotoya Casella, MD, MHA, FACP, FAAP [facility] [place], [place] [phone]   03/01/2013, 4:11 PM

## 2013-03-02 LAB — URINALYSIS, COMPLETE
BILIRUBIN URINE: NEGATIVE
Bacteria, UA: NONE SEEN
CASTS: NONE SEEN
Crystals: NONE SEEN
Glucose, UA: NEGATIVE mg/dL
KETONES UR: NEGATIVE mg/dL
Leukocytes, UA: NEGATIVE
Nitrite: NEGATIVE
PH: 6 (ref 5.0–8.0)
Protein, ur: NEGATIVE mg/dL
Specific Gravity, Urine: 1.021 (ref 1.005–1.030)
Urobilinogen, UA: 0.2 mg/dL (ref 0.0–1.0)

## 2013-03-09 ENCOUNTER — Telehealth: Payer: Self-pay | Admitting: *Deleted

## 2013-03-09 MED ORDER — GEMFIBROZIL 600 MG PO TABS: 600.0000 mg | ORAL_TABLET | Freq: Two times a day (BID) | ORAL | Status: DC

## 2013-03-09 NOTE — Telephone Encounter (Signed)
Message copied by Farrell OursEVANS, Jaeden [place]bay K on Fri Mar 09, 2013 11:48 AM ------      Message from: Jeanann LewandowskyJEGEDE, OLUGBEMIGA E      Created: Mon Mar 05, 2013  3:04 PM       Please call to inform patient that her lab results are mostly normal except for very high triglyceride level, which is a form of cholesterol that we need to start her on medication            Please call in gemfibrozil 600 mg tablet by mouth twice a day, 180 tablets with 3 refills ------

## 2013-03-09 NOTE — Telephone Encounter (Signed)
Spoke with patient's partner through interpretor and informed him and patient. I have informed them of the labs and per their request I am sending the rx to [facility] in [place].

## 2013-04-02 ENCOUNTER — Encounter (HOSPITAL_COMMUNITY): Payer: Self-pay | Admitting: Emergency Medicine

## 2013-04-02 ENCOUNTER — Emergency Department (HOSPITAL_COMMUNITY)
Admission: EM | Admit: 2013-04-02 | Discharge: 2013-04-03 | Disposition: A | Discharge status: Home / Self Care | Payer: MEDICAID | Attending: Emergency Medicine | Admitting: Emergency Medicine

## 2013-04-02 ENCOUNTER — Ambulatory Visit (HOSPITAL_BASED_OUTPATIENT_CLINIC_OR_DEPARTMENT_OTHER): Payer: Self-pay | Attending: Internal Medicine | Admitting: Radiology

## 2013-04-02 ENCOUNTER — Emergency Department (HOSPITAL_COMMUNITY): Payer: MEDICAID

## 2013-04-02 DIAGNOSIS — R0789 Other chest pain: Secondary | ICD-10-CM | POA: Insufficient documentation

## 2013-04-02 DIAGNOSIS — I10 Essential (primary) hypertension: Secondary | ICD-10-CM | POA: Insufficient documentation

## 2013-04-02 DIAGNOSIS — R079 Chest pain, unspecified: Secondary | ICD-10-CM

## 2013-04-02 LAB — COMPREHENSIVE METABOLIC PANEL
ALT: 27 U/L (ref 0–35)
AST: 28 U/L (ref 0–37)
Albumin: 3.9 g/dL (ref 3.5–5.2)
Alkaline Phosphatase: 80 U/L (ref 39–117)
BUN: 14 mg/dL (ref 6–23)
CO2: 21 mEq/L (ref 19–32)
Calcium: 9.5 mg/dL (ref 8.4–10.5)
Chloride: 103 mEq/L (ref 96–112)
Creatinine, Ser: 0.49 mg/dL — ABNORMAL LOW (ref 0.50–1.10)
GLUCOSE: 116 mg/dL — AB (ref 70–99)
POTASSIUM: 4.3 meq/L (ref 3.7–5.3)
Sodium: 140 mEq/L (ref 137–147)
TOTAL PROTEIN: 7.5 g/dL (ref 6.0–8.3)
Total Bilirubin: 0.3 mg/dL (ref 0.3–1.2)

## 2013-04-02 LAB — CBC
HEMATOCRIT: 40.2 % (ref 36.0–46.0)
HEMOGLOBIN: 14 g/dL (ref 12.0–15.0)
MCH: 30.4 pg (ref 26.0–34.0)
MCHC: 34.8 g/dL (ref 30.0–36.0)
MCV: 87.4 fL (ref 78.0–100.0)
Platelets: 231 10*3/uL (ref 150–400)
RBC: 4.6 MIL/uL (ref 3.87–5.11)
RDW: 12.9 % (ref 11.5–15.5)
WBC: 10.2 10*3/uL (ref 4.0–10.5)

## 2013-04-02 LAB — I-STAT TROPONIN, ED: TROPONIN I, POC: 0 ng/mL (ref 0.00–0.08)

## 2013-04-02 MED ORDER — GI COCKTAIL ~~LOC~~
30.0000 mL | Freq: Once | ORAL | Status: AC
  Administered 2013-04-02: 30 mL via ORAL
  Filled 2013-04-02: qty 30

## 2013-04-02 NOTE — ED Notes (Signed)
Pt complains of a pinching type chest pain since this morning, also complains of being nausesated

## 2013-04-02 NOTE — ED Provider Notes (Signed)
CSN: [id number]     Arrival date & time 04/02/13  2032 History   First MD Initiated Contact with Patient 04/02/13 2120     Chief Complaint  Patient presents with  . Chest Pain     (Consider location/radiation/quality/duration/timing/severity/associated sxs/prior Treatment) HPI Comments: Patient is a 38 yo F PMHx significant for hypertension presented to the emergency department for central chest pain that she describes as "pinching" without radiation. Patient states that her pain started this morning while at work. She states initially her pain would come and go lasting about 10 minutes at most. She states her pain has been more constant as the day has progressed. Patient states she had chest pain similar to this one week ago that resolved after a day. Patient was at a sleep study tonight and they advised she come to the ED. Patient states last week she had associated nausea, but does not today. She denies any SOB, cough, diaphoresis, nausea, emesis, abdominal pain. No early familial cardiac history. No history of echocardiogram, stress tests, or cardiac catherizations. PERC negative.   Patient is a 38 y.o. female presenting with chest pain.  Chest Pain Associated symptoms: no abdominal pain, no cough, no fever, no nausea, no palpitations, no shortness of breath and not vomiting     Past Medical History  Diagnosis Date  . Hypertension    Past Surgical History  Procedure Laterality Date  . Cesarean section     Family History  Problem Relation Age of Onset  . Hypertension Mother    History  Substance Use Topics  . Smoking status: Never Smoker   . Smokeless tobacco: Not on file  . Alcohol Use: No   OB History   Grav Para Term Preterm Abortions TAB SAB Ect Mult Living   1 1             Review of Systems  Constitutional: Negative for fever and chills.  Respiratory: Negative for cough and shortness of breath.   Cardiovascular: Positive for chest pain. Negative for palpitations and  leg swelling.  Gastrointestinal: Negative for nausea, vomiting and abdominal pain.  All other systems reviewed and are negative.      Allergies  Gemfibrozil  Home Medications   Current Outpatient Rx  Name  Route  Sig  Dispense  Refill  . albuterol (PROVENTIL HFA;VENTOLIN HFA) 108 (90 BASE) MCG/ACT inhaler   Inhalation   Inhale 2 puffs into the lungs every 2 (two) hours as needed for wheezing or shortness of breath (cough).   1 Inhaler   0   . naproxen (NAPROSYN) 500 MG tablet   Oral   Take 1 tablet (500 mg total) by mouth 2 (two) times daily with a meal.   30 tablet   0    BP 154/85  Pulse 88  Temp(Src) 98.2 F (36.8 C) (Oral)  Resp 20  SpO2 98%  LMP 03/26/2013 Physical Exam  Nursing note and vitals reviewed. Constitutional: She is oriented to person, place, and time. She appears well-developed and well-nourished. No distress.  HENT:  Head: Normocephalic and atraumatic.  Right Ear: External ear normal.  Left Ear: External ear normal.  Nose: Nose normal.  Mouth/Throat: Oropharynx is clear and moist. No oropharyngeal exudate.  Eyes: Conjunctivae are normal.  Neck: Neck supple.  Cardiovascular: Normal rate, regular rhythm, normal heart sounds and intact distal pulses.   Pulmonary/Chest: Effort normal and breath sounds normal. No respiratory distress. She exhibits no tenderness.  Abdominal: Soft. Bowel sounds are normal. There  is no tenderness.  Musculoskeletal: Normal range of motion. She exhibits no edema.  Lymphadenopathy:    She has no cervical adenopathy.  Neurological: She is alert and oriented to person, place, and time.  Skin: Skin is warm and dry. She is not diaphoretic.    ED Course  Procedures (including critical care time) Medications  gi cocktail (Maalox,Lidocaine,Donnatal) (30 mLs Oral Given 04/02/13 2329)    Labs Review Labs Reviewed  COMPREHENSIVE METABOLIC PANEL - Abnormal; Notable for the following:    Glucose, Bld 116 (*)    Creatinine,  Ser 0.49 (*)    All other components within normal limits  CBC  I-STAT TROPOININ, ED   Imaging Review Dg Chest 2 View  04/02/2013   CLINICAL DATA:  Chest pain.  EXAM: CHEST  2 VIEW  COMPARISON:  January 28, 2013.  FINDINGS: The heart size and mediastinal contours are within normal limits. Both lungs are clear. No pneumothorax or pleural effusion is noted. The visualized skeletal structures are unremarkable.  IMPRESSION: No acute cardiopulmonary abnormality seen.   Electronically Signed   By: Roque LiasJames  Green M.D.   On: 04/02/2013 22:51     EKG Interpretation   Date/Time:  Monday April 02 2013 20:42:23 EDT Ventricular Rate:  87 PR Interval:  134 QRS Duration: 84 QT Interval:  362 QTC Calculation: 435 R Axis:   61 Text Interpretation:  Sinus rhythm Baseline wander in lead(s) II aVR V1 V2  V3 V4 V5 No significant change since last tracing Confirmed by BEATON  MD,  ROBERT ([id number]) on 04/02/2013 9:45:19 PM      MDM   Final diagnoses:  Chest pain    Filed Vitals:   04/02/13 2036  BP: 154/85  Pulse: 88  Temp: 98.2 F (36.8 C)  Resp: 20    Heart Score is 1 TIMI Score is 0    Patient with constant CP since this evening. Physical exam unremarkable. RRR w/o MRG. Lungs clear. CXR negative. EKG unremarkable. CMP and CBC reviewed. First troponin negative. Plan to delta troponin with likely plan to d/c. Patient d/w with Dr. Radford PaxBeaton, agrees with plan.   Signed out to Earley FavorGail Schulz, NP  Jeannetta EllisJennifer L Seniya Stoffers, PA-C 04/03/13 [phone]

## 2013-04-03 LAB — TROPONIN I: Troponin I: <0.3 ng/mL (ref <0.30)

## 2013-04-03 NOTE — Sleep Study (Signed)
Pt was complaning of chest pain. She said ," she been having chest pain for a week now with pain going up and down her left arm.  She ask  to rescheduled and left to go to the emergency room.

## 2013-04-03 NOTE — Discharge Instructions (Signed)
Please follow up with your primary care physician in 1-2 days. If you do not have one please call the [facility] and wellness Center number listed above. Please follow up with the [facility] Office to schedule a follow up appointment. Please read all discharge instructions and return precautions.    Dolor en el pecho (inespecfico) (Chest Pain, Nonspecific) Frecuentemente es difcil dar un diagnstico especfico de la causa de un dolor en el pecho. Siempre existe una posibilidad de que el dolor est relacionado con algo ms grave como un ataque cardaco o un cogulo en los pulmones. Necesitar realizar un seguimiento con el mdico para Catering manager.  CAUSAS  Acidez.  Neumona o bronquitis.  Ansiedad o estrs.  Una inflamacin de la zona que rodea al corazn (pericarditis) o a los pulmones (pleuritis, pleuresa).  Un cogulo sanguneo en el pulmn.  Pulmones colapsados (neumotrax). Puede aparecer de Gus [place] repentina por s solo (neumotrax espontneo) o por un traumatismo en el pecho.  Culebrilla (virus del herpes zster). Las paredes del pecho estn compuestas de [place], msculos y Dietitian. Cualquiera de estos puede ser fuente del dolor.  Puede haber una contusin en los huesos debido a una lesin.  Puede haber un esguince en los msculos o el cartlago ocasionado por la tos o un esfuerzo.  El cartlago tambin puede verse afectado por una inflamacin y Engineer, agricultural (costocondritis). DIAGNSTICO Puede ser necesario realizar anlisis de laboratorio u otros estudios tales como radiografas, Materials engineer, prueba de esfuerzo, o diagnstico por imgenes para el corazn para determinar la causa de su dolor.  TRATAMIENTO  El tratamiento depender de la causa que provoque el dolor en el pecho. El tratamiento pueden incluir:  Bloqueadores de cido para la acidez estomacal.  Medicamentos antiinflamatorios.  Analgsicos para las enfermedades  inflamatorias.  Antibiticos si existe infeccin.  Podrn aconsejarle que modifique su estilo de vida. Esto incluye dejar de fumar y evitar el alcohol, la cafena y el chocolate.  Se le aconsejar que duerma con la cabeza levantada [place]). Esto reduce la probabilidad de que el cido vuelva hacia atrs desde el estmago hasta el esfago.  La mayora de las veces, Chief Technology Officer en el pecho no especfico mejora dentro de los 2 a 3 das con reposo y medicamentos para Technical sales engineer. INSTRUCCIONES PARA EL CUIDADO DOMICILIARIO  Si le prescriben antibiticos, tmelos tal como se le indic. Tmelos todos, aunque se sienta mejor.  Durante los [address], evite la actividad fsica que le hace Civil engineer, contracting. Contine con las actividades fsicas tal como se le indic.  No fume.  Evite consumir alcohol.  Slo tome medicamentos de Sales promotion account executive o prescriptos para Primary school teacher, las molestias o bajar la fiebre segn las indicaciones de su mdico.  Siga las indicaciones del profesional que lo asiste para un mayor control si los problemas persisten.  Cumpla con todas las visitas de control programadas. Si no lo hace, podr desarrollar problemas permanentes (crnicos) relacionados con el dolor. Si tiene algn problema para asistir a la cita, debe comunicarse con el establecimiento para obtener asistencia. SOLICITE ATENCIN MDICA SI:  Tiene problemas que considere que pueden ser efectos secundarios de los medicamentos que toma. Lea con cuidado las recomendaciones para su medicacin.  El dolor en el pecho contina incluso despus de haber seguido el tratamiento.  Observa una erupcin en el pecho, que presenta ampollas. SOLICITE ATENCIN MDICA DE INMEDIATO SI:  El dolor en el pecho aumenta o se extiende al  brazo, cuello, mandbula, espalda o abdomen.  Le falta el aire, aumenta la tos o tose y Friendlyescupe sangre.  Tiene dolor intenso en la espalda o el abdomen, nuseas o vmitos.  Presenta  debilidad, desmayos o escalofros.  Tiene fiebre. ESTO ES UNA EMERGENCIA. No espere a que el dolor se vaya. Pida ayuda mdica de inmediato. Comunquese con el servicio de urgencias de su localidad (911 en los [place]). No maneje solo OfficeMax Incorporatedhasta el hospital. EST SEGURO QUE:   Comprende las instrucciones para el alta mdica.  Controlar su enfermedad.  Solicitar atencin mdica de inmediato segn las indicaciones. Document Released: 01/11/2005 Document Revised: 04/05/2011 [facility] Patient Information 2014 [place], [place].

## 2013-04-03 NOTE — ED Provider Notes (Signed)
Medical screening examination/treatment/procedure(s) were performed by non-physician practitioner and as supervising physician I was immediately available for consultation/collaboration.   Brei Pociask L Garett Tetzloff, MD 04/03/13 1247 

## 2013-04-03 NOTE — ED Provider Notes (Signed)
Second troponin reviewed negative.  Patient will be discharged home with followup instructions  Arman FilterGail K Vinton Layson, NP 04/03/13 [phone]

## 2013-04-19 ENCOUNTER — Ambulatory Visit (HOSPITAL_BASED_OUTPATIENT_CLINIC_OR_DEPARTMENT_OTHER): Payer: Self-pay | Attending: Internal Medicine | Admitting: Sleep Medicine

## 2013-04-19 VITALS — Ht 63.6 in | Wt 168.0 lb

## 2013-04-19 DIAGNOSIS — G4730 Sleep apnea, unspecified: Secondary | ICD-10-CM

## 2013-04-19 DIAGNOSIS — G4733 Obstructive sleep apnea (adult) (pediatric): Secondary | ICD-10-CM | POA: Insufficient documentation

## 2013-04-21 DIAGNOSIS — G4730 Sleep apnea, unspecified: Secondary | ICD-10-CM

## 2013-04-21 NOTE — Sleep Study (Signed)
   NAME: Morgan Hunter DATE OF BIRTH:  12-09-1975 MEDICAL RECORD NUMBER [id number]  LOCATION: [facility]  PHYSICIAN: Verdis Koval D  DATE OF STUDY: 04/19/2013  SLEEP STUDY TYPE: Nocturnal Polysomnogram               REFERRING PHYSICIAN: Jeanann LewandowskyJegede, Olugbemiga, MD  INDICATION FOR STUDY: Hypersomnia with sleep apnea  EPWORTH SLEEPINESS SCORE:   HEIGHT: 5' 3.6" (161.5 cm)  WEIGHT: 168 lb (76.204 kg)    Body mass index is 29.22 kg/(m^2).  NECK SIZE:   in.  MEDICATIONS: Charted for review  SLEEP ARCHITECTURE: Split study protocol. During the diagnostic phase total sleep time 160 minutes with sleep efficiency 82.9%. Stage I was 2.2%, stage II 46.3%, stage III 35.3%, REM 16.3% of total sleep time. Sleep latency 23.5 minutes, REM latency 76 minutes, awake after sleep onset 1.5 minutes, arousal index 7.5, bedtime medication: None  RESPIRATORY DATA: Apnea hypopnea index (AHI) 17.3 per hour. 46 total events scored including 17 obstructive apneas, 1 mixed apnea, 28 hypopneas. Events were associated with supine sleep position. REM AHI 85.4. CPAP was titrated to 12 CWP, AHI 0 per hour. She wore a small [employer] mask with heated humidifier and EPR of 3.  OXYGEN DATA: Moderately loud snoring before CPAP, with oxygen desaturation to a nadir of 73% on room air. With CPAP control, snoring was prevented and mean oxygen saturation of 96.8% on room air.  CARDIAC DATA: Normal sinus rhythm  MOVEMENT/PARASOMNIA: No significant movement disturbance, no bathroom trips  IMPRESSION/ RECOMMENDATION:   1)  Moderate obstructive sleep apnea/hypopneas syndrome, AHI 17.3 per hour with supine events. Moderately loud snoring with oxygen desaturation to a nadir of 73% on room air. 2) Successful CPAP titration to 12 CWP, AHI 0 per hour. She wore a small [employer] Eson mask with heated humidifier and an EPR of 3. Snoring was prevented and mean oxygen saturation of 96.8% on CPAP with  room air.   Signed Jetty Duhamellinton Johna Kearl M.D. Waymon BudgeYOUNG,Demaurion Dicioccio D Diplomate, American Board of Sleep Medicine  ELECTRONICALLY SIGNED ON:  04/21/2013, 1:13 PM [facility] PH: [phone]   FX: [phone] ACCREDITED BY THE AMERICAN ACADEMY OF SLEEP MEDICINE

## 2013-05-31 ENCOUNTER — Ambulatory Visit: Payer: Self-pay | Admitting: Internal Medicine

## 2013-11-26 ENCOUNTER — Encounter (HOSPITAL_COMMUNITY): Payer: Self-pay | Admitting: Emergency Medicine

## 2014-11-05 IMAGING — CR DG CHEST 2V
2 series · 2 of 2 positions shown · non-contrast
Comparison: January 28, 2013.

CLINICAL DATA: Chest pain.

EXAM:
CHEST  2 VIEW

[w chest pa]
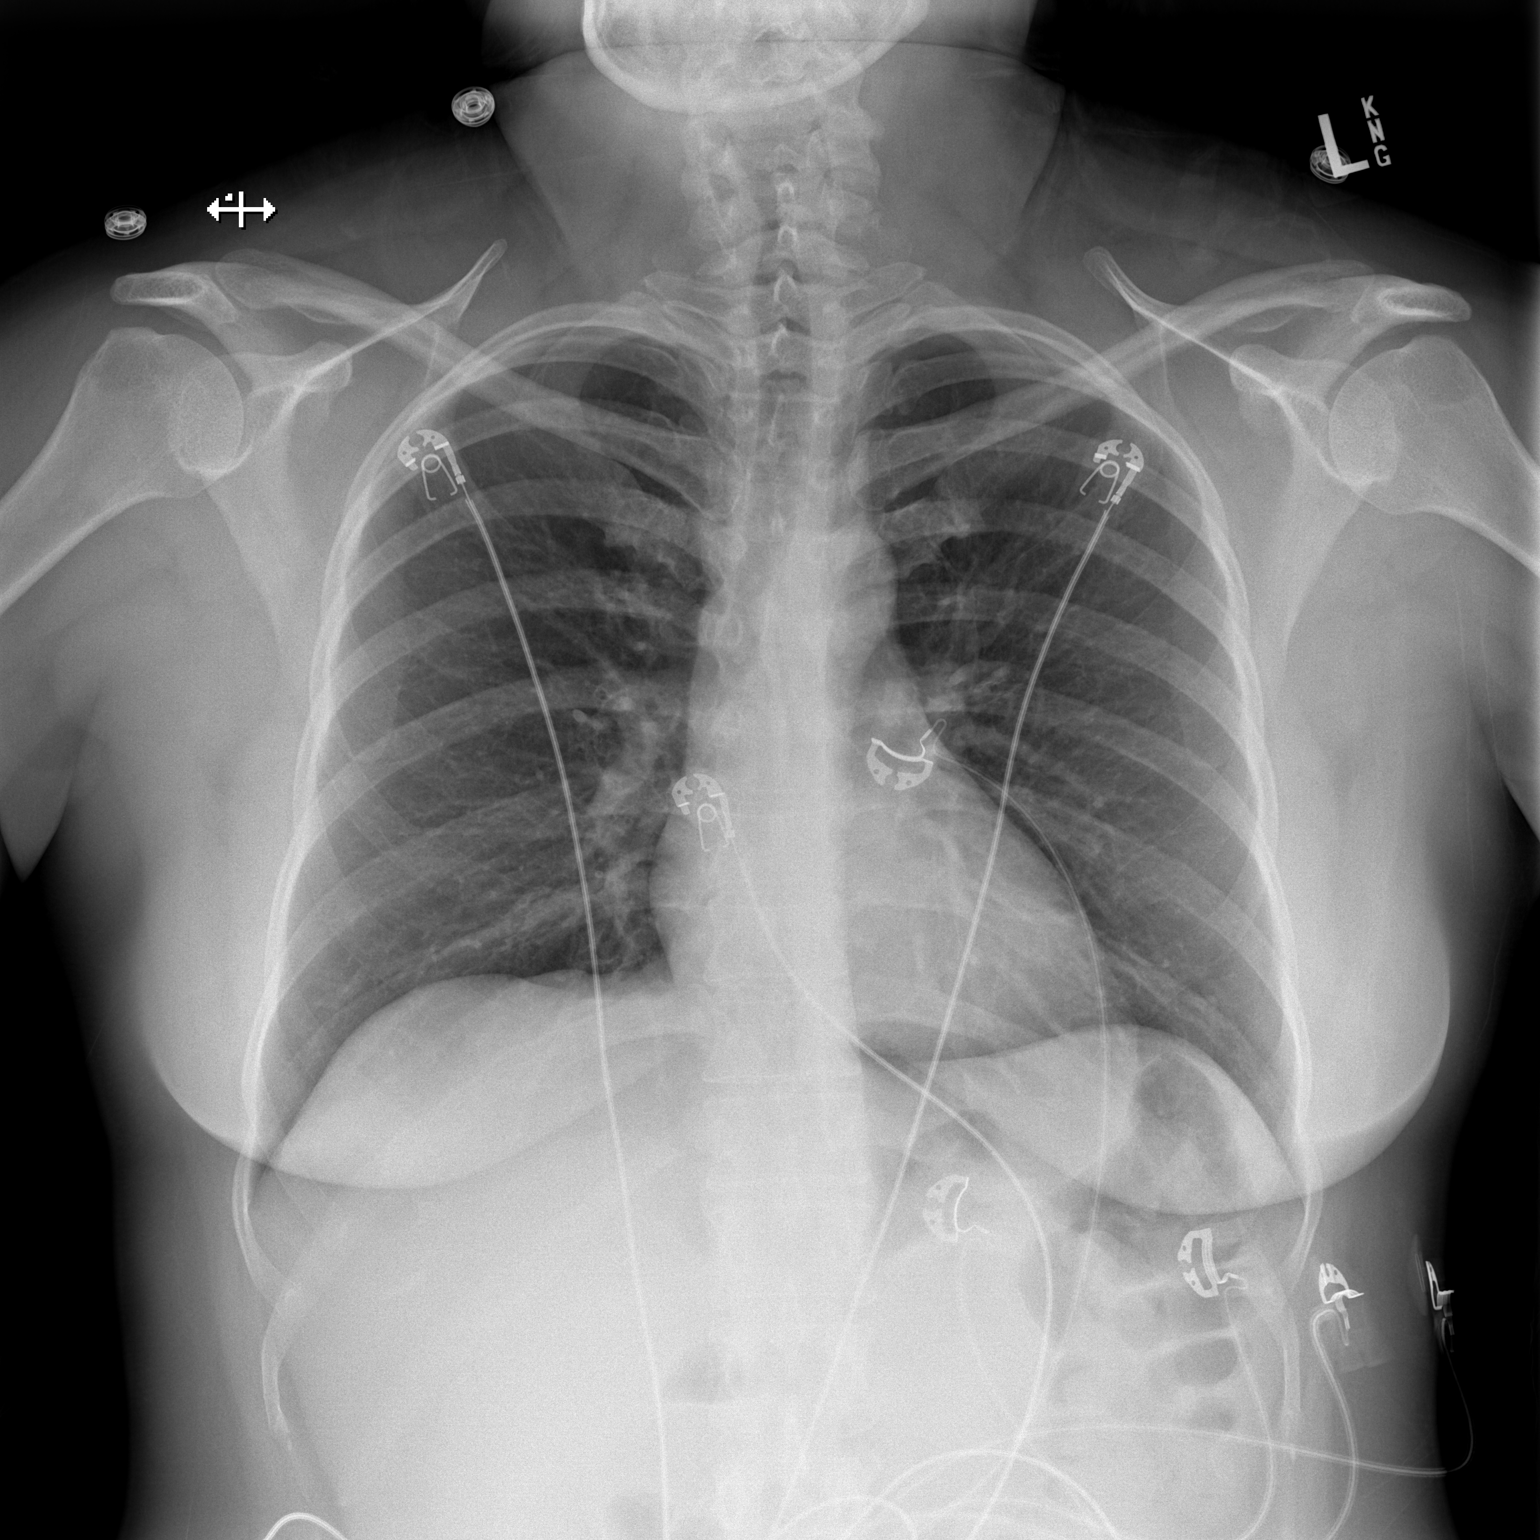

[w chest lat]
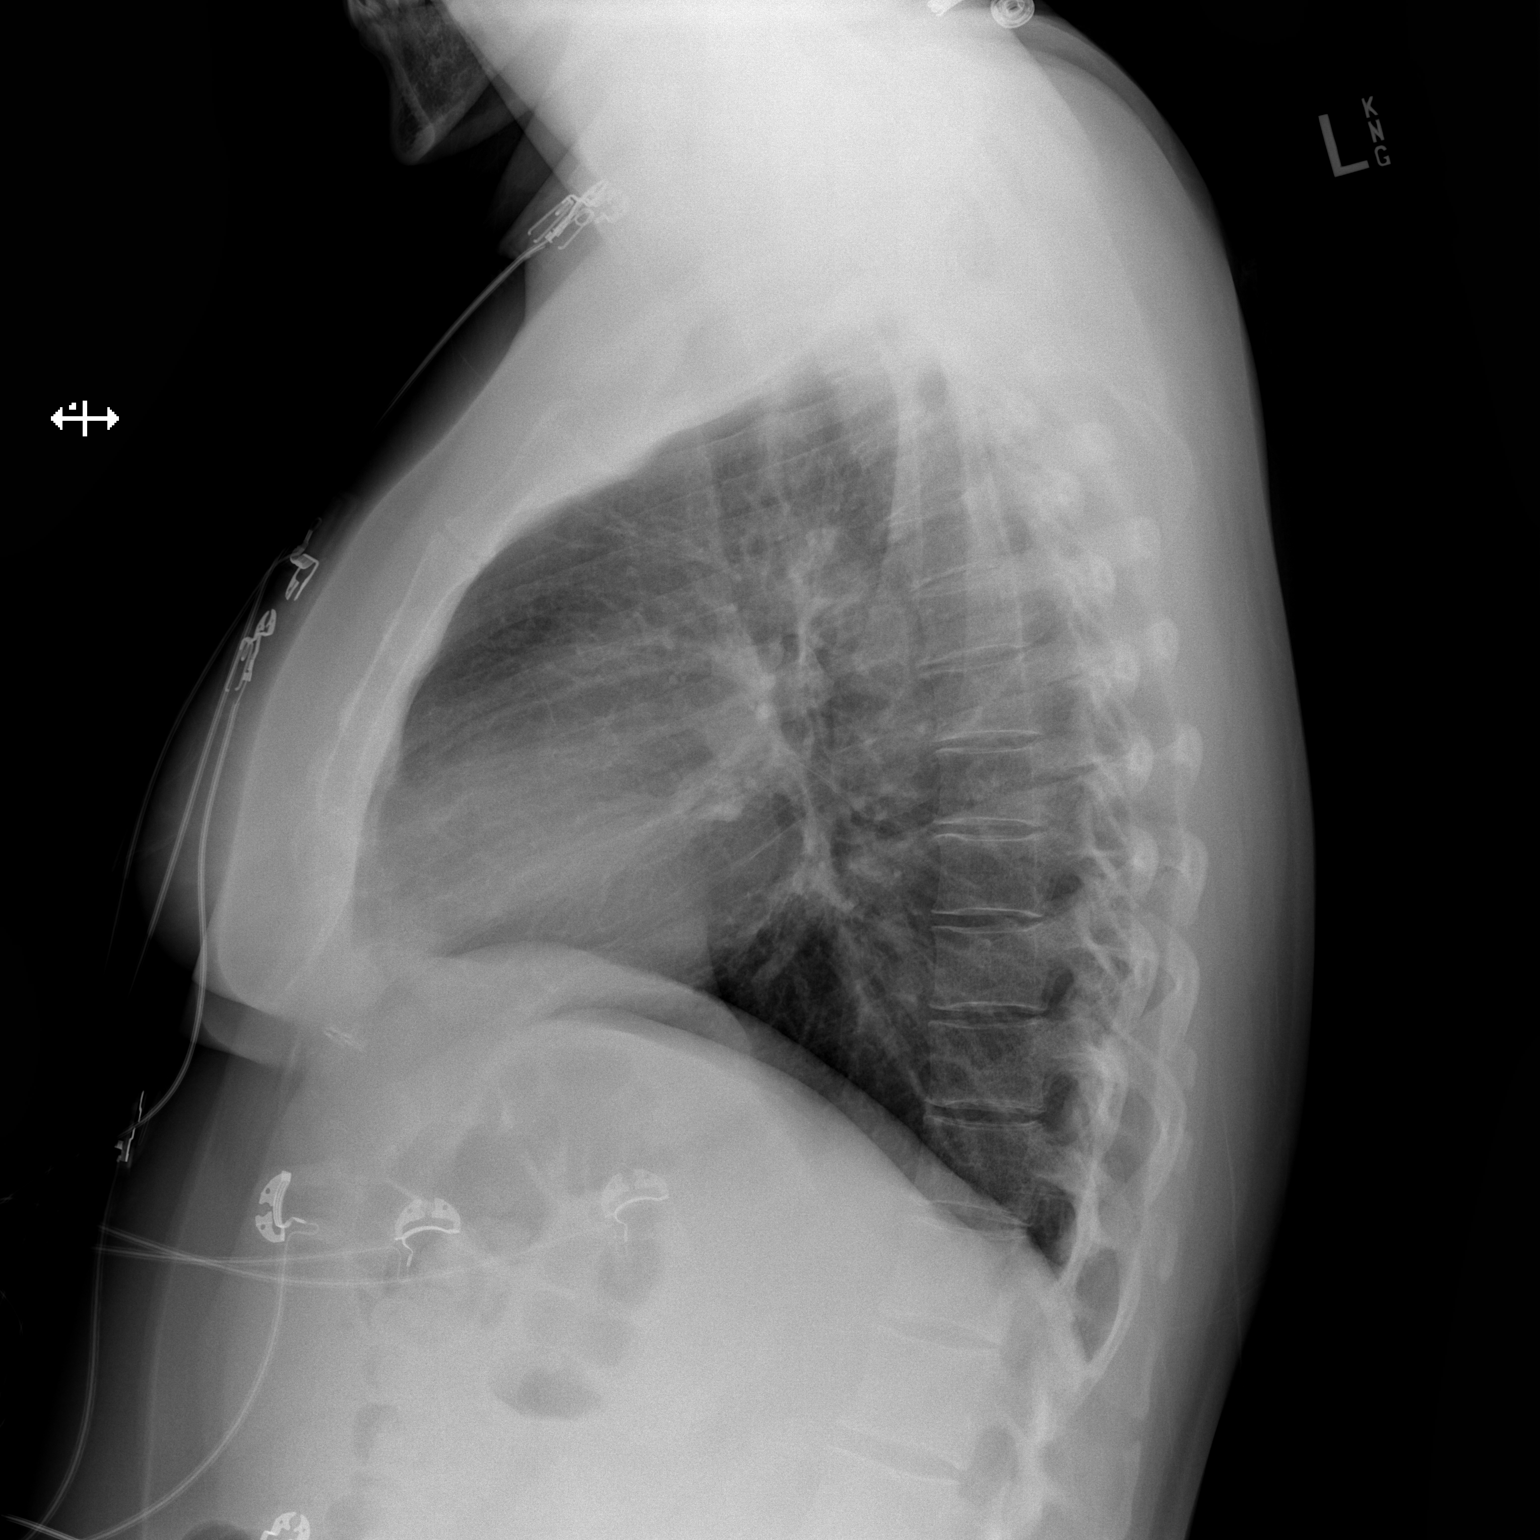

[2 of 2 positions shown; findings below may reference images not displayed]

FINDINGS: The heart size and mediastinal contours are within normal limits.
Both lungs are clear. No pneumothorax or pleural effusion is noted.
The visualized skeletal structures are unremarkable.
IMPRESSION: No acute cardiopulmonary abnormality seen.
